# Patient Record
Sex: Female | Born: 1995 | Race: Black or African American | Hispanic: No | Marital: Single | Smoking: Never smoker
Health system: Northeastern US, Academic
[De-identification: ages and names within clinical notes are randomized; demographics above are authoritative.]

## PROBLEM LIST (undated history)

## (undated) DIAGNOSIS — Z973 Presence of spectacles and contact lenses: Secondary | ICD-10-CM

## (undated) HISTORY — DX: Presence of spectacles and contact lenses: Z97.3

---
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
## 2014-03-02 ENCOUNTER — Encounter: Payer: Self-pay | Admitting: Family Medicine

## 2014-03-02 ENCOUNTER — Ambulatory Visit (INDEPENDENT_AMBULATORY_CARE_PROVIDER_SITE_OTHER): Payer: BC Managed Care – PPO | Admitting: Family Medicine

## 2014-03-02 VITALS — BP 106/72 | HR 60 | Temp 98.2°F | Ht 64.0 in | Wt 148.0 lb

## 2014-03-02 DIAGNOSIS — H6123 Impacted cerumen, bilateral: Secondary | ICD-10-CM

## 2014-03-02 NOTE — Patient Instructions (Signed)
Cerumen Impaction °A cerumen impaction is when the wax in your ear forms a plug. This plug usually causes reduced hearing. Sometimes it also causes an earache or dizziness. Removing a cerumen impaction can be difficult and painful. The wax sticks to the ear canal. The canal is sensitive and bleeds easily. If you try to remove a heavy wax buildup with a cotton tipped swab, you may push it in further. °Irrigation with water, suction, and small ear curettes may be used to clear out the wax. If the impaction is fixed to the skin in the ear canal, ear drops may be needed for a few days to loosen the wax. People who build up a lot of wax frequently can use ear wax removal products available in your local drugstore. °SEEK MEDICAL CARE IF:  °You develop an earache, increased hearing loss, or marked dizziness. °Document Released: 03/28/2004 Document Revised: 05/13/2011 Document Reviewed: 05/18/2009 °ExitCare® Patient Information ©2015 [employer]. This information is not intended to replace advice given to you by your health care provider. Make sure you discuss any questions you have with your health care provider. ° °

## 2014-03-02 NOTE — Progress Notes (Signed)
Chief Complaint  Patient presents with  . Ear Fullness    feels like her ears clogged x 4 days.     Both ears have sound muffled for the last 4 days.  Denies any pain or discomfort--sometimes if she pushes on the front it feels a little sore.  Denies any URI or allergy symptoms. She had a sinus infection about a month ago (treated with amoxicillin, and symptoms completely resolved); at that time she was noted to have a lot of ear wax.  She was using OTC ear wax drops regularly up until about 2 weeks ago.  When she stopped using them, they seemed to clog back up.  She has been using Q-tips to clean her ears.  PMH, PSH, SH reviewed.  No current outpatient prescriptions on file prior to visit.   No current facility-administered medications on file prior to visit.   No Known Allergies  ROS:  No fevers, chills, headaches, dizziness, tinnitus.  +decreased hearing.  No URI or allergy symptoms, PND, cough, shortness of breath, chest pain, nausea, vomiting, diarrhea, rash or other complaints.  PHYSICAL EXAM: BP 106/72 mmHg  Pulse 60  Temp(Src) 98.2 F (36.8 C) (Tympanic)  Ht 5\' 4"  (1.626 m)  Wt 148 lb (67.132 kg)  BMI 25.39 kg/m2  LMP 02/20/2014 Pleasant female in no distress HEENT:  PERRL, EOMI, conjunctiva clear.  TM's and EAC's were completely occluded by hard cerumen bilaterally.  OP is clear.  Nasal mucosa was normal  After lavage--left canal was red--she had some discomfort during lavage process.  There was no inflammation/swelling.  TM slightly red, poss some fluid.  She reports that hearing was completely normal.  Right EAC and TM were completely normal afterlavage  ASSESSMENT/PLAN:  Cerumen impaction, bilateral - advised to stop using Q-tips. use debrox drops prn recurrence of symptoms

## 2014-10-12 ENCOUNTER — Encounter: Payer: Self-pay | Admitting: Family Medicine

## 2014-10-12 ENCOUNTER — Ambulatory Visit (INDEPENDENT_AMBULATORY_CARE_PROVIDER_SITE_OTHER): Payer: Commercial Managed Care - PPO | Admitting: Family Medicine

## 2014-10-12 VITALS — BP 116/72 | HR 65 | Resp 14 | Ht 64.5 in | Wt 155.0 lb

## 2014-10-12 DIAGNOSIS — Z0000 Encounter for general adult medical examination without abnormal findings: Secondary | ICD-10-CM

## 2014-10-12 NOTE — Progress Notes (Signed)
   Subjective:    Patient ID: [name], female    DOB: 11-Jun-1995, 19 y.o.   MRN: [id number]  HPI She is here mainly for an athletic checkup. This is required by her college. She does play tennis. She has had no injuries, illnesses, sprains or strains. No chest pain or shortness of breath. She does not smoke and is not sexually active. Is on no medications.   Review of Systems     Objective:   Physical Exam And in no distress. Cardiac exam shows regular rhythm without murmurs or gallops. Lungs are clear to auscultation. Her immunizations were reviewed and she is up-to-date.       Assessment & Plan:  Normal physical exam Return here as needed.

## 2014-10-25 ENCOUNTER — Encounter: Payer: Self-pay | Admitting: Family Medicine

## 2015-07-24 ENCOUNTER — Other Ambulatory Visit (INDEPENDENT_AMBULATORY_CARE_PROVIDER_SITE_OTHER): Payer: Managed Care, Other (non HMO)

## 2015-07-24 DIAGNOSIS — Z111 Encounter for screening for respiratory tuberculosis: Secondary | ICD-10-CM | POA: Diagnosis not present

## 2015-07-26 LAB — TB SKIN TEST
INDURATION: 0 mm
TB SKIN TEST: NEGATIVE

## 2016-11-11 ENCOUNTER — Telehealth: Payer: Self-pay | Admitting: Family Medicine

## 2016-11-11 NOTE — Telephone Encounter (Signed)
Pt would like to come in foe a tdap. She is starting a new job and updated tdap is required. Is this ok?

## 2016-11-11 NOTE — Telephone Encounter (Signed)
LMTCB

## 2016-11-11 NOTE — Telephone Encounter (Signed)
Set her up for nurse visit for that and see if you can also talk her into a flu shot

## 2016-11-12 NOTE — Telephone Encounter (Signed)
LMTCB

## 2016-11-13 NOTE — Telephone Encounter (Signed)
LMTCB- completing message since not able to reach pt. [name]/RLB

## 2016-11-14 ENCOUNTER — Other Ambulatory Visit: Payer: 59

## 2016-11-18 ENCOUNTER — Other Ambulatory Visit: Payer: Managed Care, Other (non HMO)

## 2016-11-18 ENCOUNTER — Other Ambulatory Visit (INDEPENDENT_AMBULATORY_CARE_PROVIDER_SITE_OTHER): Payer: 59

## 2016-11-18 DIAGNOSIS — Z111 Encounter for screening for respiratory tuberculosis: Secondary | ICD-10-CM | POA: Diagnosis not present

## 2016-11-20 LAB — TB SKIN TEST: TB Skin Test: NEGATIVE

## 2016-11-25 ENCOUNTER — Other Ambulatory Visit: Payer: 59

## 2016-11-26 ENCOUNTER — Encounter: Payer: Self-pay | Admitting: Family Medicine

## 2016-11-26 ENCOUNTER — Ambulatory Visit (INDEPENDENT_AMBULATORY_CARE_PROVIDER_SITE_OTHER): Payer: 59 | Admitting: Family Medicine

## 2016-11-26 VITALS — BP 108/70 | HR 77 | Ht 65.0 in | Wt 147.8 lb

## 2016-11-26 DIAGNOSIS — Z0000 Encounter for general adult medical examination without abnormal findings: Secondary | ICD-10-CM | POA: Diagnosis not present

## 2016-11-26 DIAGNOSIS — Z1159 Encounter for screening for other viral diseases: Secondary | ICD-10-CM

## 2016-11-26 LAB — POCT URINALYSIS DIP (PROADVANTAGE DEVICE)
BILIRUBIN UA: NEGATIVE mg/dL
Bilirubin, UA: NEGATIVE
Glucose, UA: NEGATIVE mg/dL
LEUKOCYTES UA: NEGATIVE
NITRITE UA: NEGATIVE
PH UA: 6 (ref 5.0–8.0)
RBC UA: NEGATIVE
Specific Gravity, Urine: 1.03
Urobilinogen, Ur: NEGATIVE

## 2016-11-26 NOTE — Progress Notes (Signed)
   Subjective:    Patient ID: [name], female    DOB: 26-Sep-1995, 21 y.o.   MRN: [id number]  HPI She is here for complete examination. She has enjoyed excellent health and presently is on no medications. No history of allergies, GI or pulmonary problems.her past medical history isdoes not smoke or drink. 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 She eventually wants to apply to medical school. She does take OTC vitamins  Review of Systems Negative.    Objective:   Physical Exam Alert and in no distress. Tympanic membranes and canals are normal. Pharyngeal area is normal. Neck is supple without adenopathy or thyromegaly. Cardiac exam shows a regular sinus rhythm without murmurs or gallops. Lungs are clear to auscultation.abdominal exam shows no masses or tenderness with normal bowel sounds        Assessment & Plan:  Routine general medical examination at a health care facility - Plan: POCT Urinalysis DIP (Proadvantage Device), CBC with Differential/Platelet, Comprehensive metabolic panel  Need for hepatitis B screening test - Plan: Hepatitis B surface antigen, Hepatitis B surface antibody  She has had one PPD and another one will be put on tomorrow.

## 2016-11-27 ENCOUNTER — Other Ambulatory Visit (INDEPENDENT_AMBULATORY_CARE_PROVIDER_SITE_OTHER): Payer: 59

## 2016-11-27 DIAGNOSIS — Z111 Encounter for screening for respiratory tuberculosis: Secondary | ICD-10-CM | POA: Diagnosis not present

## 2016-11-27 LAB — CBC WITH DIFFERENTIAL/PLATELET
BASOS PCT: 0.5 %
Basophils Absolute: 44 cells/uL (ref 0–200)
Eosinophils Absolute: 52 cells/uL (ref 15–500)
Eosinophils Relative: 0.6 %
HCT: 35.9 % (ref 35.0–45.0)
Hemoglobin: 12.3 g/dL (ref 11.7–15.5)
Lymphs Abs: 2279 cells/uL (ref 850–3900)
MCH: 27.9 pg (ref 27.0–33.0)
MCHC: 34.3 g/dL (ref 32.0–36.0)
MCV: 81.4 fL (ref 80.0–100.0)
MONOS PCT: 9.6 %
MPV: 9.5 fL (ref 7.5–12.5)
Neutro Abs: 5490 cells/uL (ref 1500–7800)
Neutrophils Relative %: 63.1 %
PLATELETS: 420 10*3/uL — AB (ref 140–400)
RBC: 4.41 10*6/uL (ref 3.80–5.10)
RDW: 14.4 % (ref 11.0–15.0)
TOTAL LYMPHOCYTE: 26.2 %
WBC mixed population: 835 cells/uL (ref 200–950)
WBC: 8.7 10*3/uL (ref 3.8–10.8)

## 2016-11-27 LAB — COMPREHENSIVE METABOLIC PANEL
AG Ratio: 1.7 (calc) (ref 1.0–2.5)
ALT: 8 U/L (ref 6–29)
AST: 16 U/L (ref 10–30)
Albumin: 4.6 g/dL (ref 3.6–5.1)
Alkaline phosphatase (APISO): 43 U/L (ref 33–115)
BUN: 13 mg/dL (ref 7–25)
CO2: 23 mmol/L (ref 20–32)
CREATININE: 0.93 mg/dL (ref 0.50–1.10)
Calcium: 9.8 mg/dL (ref 8.6–10.2)
Chloride: 102 mmol/L (ref 98–110)
GLOBULIN: 2.7 g/dL (ref 1.9–3.7)
GLUCOSE: 83 mg/dL (ref 65–99)
Potassium: 4.1 mmol/L (ref 3.5–5.3)
Sodium: 136 mmol/L (ref 135–146)
Total Bilirubin: 0.5 mg/dL (ref 0.2–1.2)
Total Protein: 7.3 g/dL (ref 6.1–8.1)

## 2016-11-27 LAB — HEPATITIS B SURFACE ANTIGEN: Hepatitis B Surface Ag: NONREACTIVE

## 2016-11-27 LAB — HEPATITIS B SURFACE ANTIBODY,QUALITATIVE: Hep B S Ab: NONREACTIVE

## 2016-11-29 LAB — TB SKIN TEST: TB Skin Test: NEGATIVE

## 2016-12-04 ENCOUNTER — Telehealth: Payer: Self-pay | Admitting: Family Medicine

## 2016-12-04 NOTE — Telephone Encounter (Signed)
Pt called and request I email her cpe to [email or web address].    Done

## 2017-07-31 ENCOUNTER — Telehealth: Payer: Self-pay | Admitting: Family Medicine

## 2017-07-31 NOTE — Telephone Encounter (Signed)
Patient called and requested copy of immunization record.  Done

## 2018-02-24 ENCOUNTER — Ambulatory Visit (INDEPENDENT_AMBULATORY_CARE_PROVIDER_SITE_OTHER): Payer: Self-pay | Admitting: Medical

## 2018-02-24 ENCOUNTER — Encounter: Payer: Self-pay | Admitting: Medical

## 2018-02-24 VITALS — BP 120/80 | HR 78 | Temp 97.8°F | Wt 149.4 lb

## 2018-02-24 DIAGNOSIS — Z113 Encounter for screening for infections with a predominantly sexual mode of transmission: Secondary | ICD-10-CM | POA: Insufficient documentation

## 2018-02-24 DIAGNOSIS — Z0000 Encounter for general adult medical examination without abnormal findings: Secondary | ICD-10-CM

## 2018-02-24 DIAGNOSIS — Z7185 Encounter for immunization safety counseling: Secondary | ICD-10-CM | POA: Insufficient documentation

## 2018-02-24 DIAGNOSIS — Z3009 Encounter for other general counseling and advice on contraception: Secondary | ICD-10-CM

## 2018-02-24 DIAGNOSIS — Z9189 Other specified personal risk factors, not elsewhere classified: Secondary | ICD-10-CM | POA: Insufficient documentation

## 2018-02-24 DIAGNOSIS — Z7189 Other specified counseling: Secondary | ICD-10-CM

## 2018-02-24 LAB — POCT URINALYSIS DIP (PROADVANTAGE DEVICE)
BILIRUBIN UA: NEGATIVE
Blood, UA: NEGATIVE
Glucose, UA: NEGATIVE mg/dL
Ketones, POC UA: NEGATIVE mg/dL
LEUKOCYTES UA: NEGATIVE
NITRITE UA: NEGATIVE
PH UA: 6.5 (ref 5.0–8.0)
Protein Ur, POC: NEGATIVE mg/dL
Specific Gravity, Urine: 1.015
Urobilinogen, Ur: 3.5

## 2018-02-24 NOTE — Addendum Note (Signed)
Addended by: Renelda LomaHENRY, Christasia Angeletti on: 02/24/2018 11:44 AM   Modules accepted: Orders

## 2018-02-24 NOTE — Progress Notes (Signed)
Subjective:   HPI  [name] [name] is a 22 y.o. female who presents for a physical Chief Complaint  Patient presents with  . 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  Happens to be here in town for the holidays.  Grew up coming here for medical care, would like a physical today while she is in town.  Feels healthy.  No current symptoms.  Uses condoms.   Not currently on birth control. Sexually active.  Gets bad cramps with periods.  No prior pregnancy.   Periods are regular, sometimes heavy with bad cramping at times.   No hx/o abnormal pap smear.   Had pap 10/2016 that was normal.     Reviewed their medical, surgical, family, social, medication, and allergy history and updated chart as appropriate.  Past Medical History:  Diagnosis Date  . Wears contact lenses     Past Surgical History:  Procedure Laterality Date  . ANTERIOR CRUCIATE LIGAMENT REPAIR  2010   left    Social History   Socioeconomic History  . Marital status: Single    Spouse name: Not on file  . Number of children: Not on file  . Years of education: Not on file  . Highest education level: Not on file  Occupational History  . Not on file  Social Needs  . Financial resource strain: Not on file  . Food insecurity:    Worry: Not on file    Inability: Not on file  . Transportation needs:    Medical: Not on file    Non-medical: Not on file  Tobacco Use  . Smoking status: Never Smoker  . Smokeless tobacco: Never Used  Substance and Sexual Activity  . Alcohol use: Yes    Comment: occ  . Drug use: No  . Sexual activity: Not on file  Lifestyle  . Physical activity:    Days per week: Not on file    Minutes per session: Not on file  . Stress: Not on file  Relationships  . Social connections:    Talks on  phone: Not on file    Gets together: Not on file    Attends religious service: Not on file    Active member of club or organization: Not on file    Attends meetings of clubs or organizations: Not on file    Relationship status: Not on file  . Intimate partner violence:    Fear of current or ex partner: Not on file    Emotionally abused: Not on file    Physically abused: Not on file    Forced sexual activity: Not on file  Other Topics Concern  . Not on file  Social History Narrative   [place] sister, [place].   Graduated from [employer], recruited for tennis.  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    Family History  Problem Relation Age of Onset  . Cancer Maternal Grandfather        lung  . Cancer Paternal Grandfather        melanoma  . Heart disease Neg Hx   . Diabetes Neg Hx   . Hyperlipidemia Neg Hx   . Hypertension Neg Hx      Current  Outpatient Medications:  .  Biotin w/ Vitamins C & E (HAIR/SKIN/NAILS PO), Take by mouth., Disp: , Rfl:  .  vitamin C (ASCORBIC ACID) 250 MG tablet, Take 250 mg by mouth daily., Disp: , Rfl:   No Known Allergies    Review of Systems Constitutional: -fever, -chills, -sweats, -unexpected weight change, -decreased appetite, -fatigue Allergy: -sneezing, -itching, -congestion Dermatology: -changing moles, --rash, -lumps ENT: -runny nose, -ear pain, -sore throat, -hoarseness, -sinus pain, -teeth pain, - ringing in ears, -hearing loss, -nosebleeds Cardiology: -chest pain, -palpitations, -swelling, -difficulty breathing when lying flat, -waking up short of breath Respiratory: -cough, -shortness of breath, -difficulty breathing with exercise or exertion, -wheezing, -coughing up blood Gastroenterology: -abdominal pain, -nausea, -vomiting, -diarrhea, -constipation, -blood in stool, -changes in bowel movement, -difficulty swallowing or eating Hematology: -bleeding,  -bruising  Musculoskeletal: -joint aches, -muscle aches, -joint swelling, -back pain, -neck pain, -cramping, -changes in gait Ophthalmology: denies vision changes, eye redness, itching, discharge Urology: -burning with urination, -difficulty urinating, -blood in urine, -urinary frequency, -urgency, -incontinence Neurology: -headache, -weakness, -tingling, -numbness, -memory loss, -falls, -dizziness Psychology: -depressed mood, -agitation, -sleep problems Breast/gyn: -breast tenderness, -discharge, -lumps, -vaginal discharge,- irregular periods, -heavy periods     Objective:  BP 120/80 (BP Location: Left Arm, Patient Position: Sitting)   Pulse 78   Temp 97.8 F (36.6 C)   Wt 149 lb 6.4 oz (67.8 kg)   LMP 02/07/2018   SpO2 96%   BMI 24.86 kg/m   General appearance: alert, no distress, WD/WN, African American female Skin: unremarkable HEENT: normocephalic, conjunctiva/corneas normal, sclerae anicteric, PERRLA, EOMi, nares patent, no discharge or erythema, pharynx normal Oral cavity: MMM, tongue normal, teeth normal Neck: supple, no lymphadenopathy, no thyromegaly, no masses, normal ROM, no bruits Chest: non tender, normal shape and expansion Heart: RRR, normal S1, S2, no murmurs Lungs: CTA bilaterally, no wheezes, rhonchi, or rales Abdomen: +bs, soft, non tender, non distended, no masses, no hepatomegaly, no splenomegaly, no bruits Back: non tender, normal ROM, no scoliosis Musculoskeletal: upper extremities non tender, no obvious deformity, normal ROM throughout, lower extremities non tender, no obvious deformity, normal ROM throughout Extremities: no edema, no cyanosis, no clubbing Pulses: 2+ symmetric, upper and lower extremities, normal cap refill Neurological: alert, oriented x 3, CN2-12 intact, strength normal upper extremities and lower extremities, sensation normal throughout, DTRs 2+ throughout, no cerebellar signs, gait normal Psychiatric: normal affect, behavior normal,  pleasant  Breast/gyn/rectal - deferred to gynecology    Assessment and Plan :   Encounter Diagnoses  Name Primary?  . Encounter for health maintenance examination in adult Yes  . Encounter for general counseling and advice on contraceptive management   . Screen for STD (sexually transmitted disease)   . Risk of exposure to communicable disease   . Vaccine counseling     Physical exam - discussed and counseled on healthy lifestyle, diet, exercise, preventative care, vaccinations, sick and well care, proper use of emergency dept and after hours care, and addressed their concerns.    Health screening: Advised they see their eye doctor yearly for routine vision care. Advised they see their dentist yearly for routine dental care including hygiene visits twice yearly. See your gynecologist yearly for routine gynecological care.  Discussed STD testing, discussed prevention, condom use, means of transmission  Cancer screening Counseled on self breast exams, mammograms, cervical cancer screening  Counseled on contraception, risk and benefits of contraception, she declines today but will consider options.  Vaccinations: Advised yearly influenza vaccine She got this through work  I reviewed her [facility] vaccine records and per our records she is not up-to-date on HPV or TD vaccine.  She notes getting TD vaccine through work within the past year but thinks she never had the HPV vaccine.  She will consider the HPV vaccine but declines today.   Acute issues discussed: none  Separate significant chronic issues discussed: none  [name] was seen today for other.  Diagnoses and all orders for this visit:  Encounter for health maintenance examination in adult -     CBC with Differential/Platelet -     HIV Antibody (routine testing w rflx) -     GC/Chlamydia Probe Amp -     RPR -     Hepatitis C antibody -     Hepatitis B surface antigen  Encounter for general counseling and advice on  contraceptive management  Screen for STD (sexually transmitted disease) -     HIV Antibody (routine testing w rflx) -     GC/Chlamydia Probe Amp -     RPR -     Hepatitis C antibody -     Hepatitis B surface antigen  Risk of exposure to communicable disease -     HIV Antibody (routine testing w rflx) -     RPR -     Hepatitis C antibody -     Hepatitis B surface antigen  Vaccine counseling  Follow-up pending labs, yearly for physical

## 2018-02-24 NOTE — Patient Instructions (Signed)
Contraception Choices Contraception, also called birth control, refers to methods or devices that prevent pregnancy. Hormonal methods Contraceptive implant  A contraceptive implant is a thin, plastic tube that contains a hormone. It is inserted into the upper part of the arm. It can remain in place for up to 3 years. Progestin-only injections Progestin-only injections are injections of progestin, a synthetic form of the hormone progesterone. They are given every 3 months by a health care provider. Birth control pills  Birth control pills are pills that contain hormones that prevent pregnancy. They must be taken once a day, preferably at the same time each day. Birth control patch  The birth control patch contains hormones that prevent pregnancy. It is placed on the skin and must be changed once a week for three weeks and removed on the fourth week. A prescription is needed to use this method of contraception. Vaginal ring  A vaginal ring contains hormones that prevent pregnancy. It is placed in the vagina for three weeks and removed on the fourth week. After that, the process is repeated with a new ring. A prescription is needed to use this method of contraception. Emergency contraceptive Emergency contraceptives prevent pregnancy after unprotected sex. They come in pill form and can be taken up to 5 days after sex. They work best the sooner they are taken after having sex. Most emergency contraceptives are available without a prescription. This method should not be used as your only form of birth control. Barrier methods Female condom  A female condom is a thin sheath that is worn over the penis during sex. Condoms keep sperm from going inside a woman's body. They can be used with a spermicide to increase their effectiveness. They should be disposed after a single use. Female condom  A female condom is a soft, loose-fitting sheath that is put into the vagina before sex. The condom keeps sperm  from going inside a woman's body. They should be disposed after a single use. Diaphragm  A diaphragm is a soft, dome-shaped barrier. It is inserted into the vagina before sex, along with a spermicide. The diaphragm blocks sperm from entering the uterus, and the spermicide kills sperm. A diaphragm should be left in the vagina for 6-8 hours after sex and removed within 24 hours. A diaphragm is prescribed and fitted by a health care provider. A diaphragm should be replaced every 1-2 years, after giving birth, after gaining more than 15 lb (6.8 kg), and after pelvic surgery. Cervical cap  A cervical cap is a round, soft latex or plastic cup that fits over the cervix. It is inserted into the vagina before sex, along with spermicide. It blocks sperm from entering the uterus. The cap should be left in place for 6-8 hours after sex and removed within 48 hours. A cervical cap must be prescribed and fitted by a health care provider. It should be replaced every 2 years. Sponge  A sponge is a soft, circular piece of polyurethane foam with spermicide on it. The sponge helps block sperm from entering the uterus, and the spermicide kills sperm. To use it, you make it wet and then insert it into the vagina. It should be inserted before sex, left in for at least 6 hours after sex, and removed and thrown away within 30 hours. Spermicides Spermicides are chemicals that kill or block sperm from entering the cervix and uterus. They can come as a cream, jelly, suppository, foam, or tablet. A spermicide should be inserted into the   vagina with an applicator at least 10-15 minutes before sex to allow time for it to work. The process must be repeated every time you have sex. Spermicides do not require a prescription. Intrauterine contraception Intrauterine device (IUD) An IUD is a T-shaped device that is put in a woman's uterus. There are two types:  Hormone IUD.This type contains progestin, a synthetic form of the hormone  progesterone. This type can stay in place for 3-5 years.  Copper IUD.This type is wrapped in copper wire. It can stay in place for 10 years.  Permanent methods of contraception Female tubal ligation In this method, a woman's fallopian tubes are sealed, tied, or blocked during surgery to prevent eggs from traveling to the uterus. Hysteroscopic sterilization In this method, a small, flexible insert is placed into each fallopian tube. The inserts cause scar tissue to form in the fallopian tubes and block them, so sperm cannot reach an egg. The procedure takes about 3 months to be effective. Another form of birth control must be used during those 3 months. Female sterilization This is a procedure to tie off the tubes that carry sperm (vasectomy). After the procedure, the man can still ejaculate fluid (semen). Natural planning methods Natural family planning In this method, a couple does not have sex on days when the woman could become pregnant. Calendar method This means keeping track of the length of each menstrual cycle, identifying the days when pregnancy can happen, and not having sex on those days. Ovulation method In this method, a couple avoids sex during ovulation. Symptothermal method This method involves not having sex during ovulation. The woman typically checks for ovulation by watching changes in her temperature and in the consistency of cervical mucus. Post-ovulation method In this method, a couple waits to have sex until after ovulation. Summary  Contraception, also called birth control, means methods or devices that prevent pregnancy.  Hormonal methods of contraception include implants, injections, pills, patches, vaginal rings, and emergency contraceptives.  Barrier methods of contraception can include female condoms, female condoms, diaphragms, cervical caps, sponges, and spermicides.  There are two types of IUDs (intrauterine devices). An IUD can be put in a woman's uterus to  prevent pregnancy for 3-5 years.  Permanent sterilization can be done through a procedure for males, females, or both.  Natural family planning methods involve not having sex on days when the woman could become pregnant. This information is not intended to replace advice given to you by your health care provider. Make sure you discuss any questions you have with your health care provider. Document Released: 02/18/2005 Document Revised: 02/20/2017 Document Reviewed: 03/23/2016 Elsevier Interactive Patient Education  2019 Elsevier Inc.     Preventing Sexually Transmitted Infections, Adult Sexually transmitted infections (STIs) are diseases that are passed (transmitted) from person to person through bodily fluids exchanged during sex or sexual contact. Bodily fluids include saliva, semen, blood, vaginal mucus, and urine. You may have an increased risk for developing an STI if you have unprotected oral, vaginal, or anal sex. Some common STIs include:  Herpes.  Hepatitis B.  Chlamydia.  Gonorrhea.  Syphilis.  HPV (human papillomavirus).  HIV (human immunodeficiency virus), the virus that can cause AIDS (acquired immunodeficiency syndrome). How can I protect myself from sexually transmitted infections? The only way to completely prevent STIs is not to have sex of any kind (practice abstinence). This includes oral, vaginal, or anal sex. If you are sexually active, take these actions to lower your risk of getting an  STI:  Have only one sex partner (be monogamous) or limit the number of sexual partners you have.  Stay up-to-date on immunizations. Certain vaccines can lower your risk of getting certain STIs, such as: ? Hepatitis A and B vaccines. You may have been vaccinated as a young child, but likely need a booster shot as a teen or young adult. ? HPV vaccine.  Use methods that prevent the exchange of body fluids between partners (barrier protection) every time you have sex. Barrier  protection can be used during oral, vaginal, or anal sex. Commonly used barrier methods include: ? Female condom. ? Female condom. ? Dental dam.  Get tested regularly for STIs. Have your sexual partner get tested regularly as well.  Avoid mixing alcohol, drugs, and sex. Alcohol and drug use can affect your ability to make good decisions and can lead to risky sexual behaviors.  Ask your health care provider about taking pre-exposure prophylaxis (PrEP) to prevent HIV infection if you: ? Have a HIV-positive sexual partner. ? Have multiple sexual partners or partners who do not know their HIV status, and do not regularly use a condom during sex. ? Use injection drugs and share needles. Birth control pills, injections, implants, and intrauterine devices (IUDs) do not protect against STIs. To prevent both STIs and pregnancy, always use a condom with another form of birth control. Some STIs, such as herpes, are spread through skin to skin contact. A condom does not protect you from getting such STIs. If you or your partner have herpes and there is an active flare with open sores, avoid all sexual contact. Why are these changes important? Taking steps to practice safe sex protects you and others. Many STIs can be cured. However, some STIs are not curable and will affect you for the rest of your life. STIs can be passed on to another person even if you do not have symptoms. What can happen if changes are not made? Certain STIs may:  Require you to take medicine for the rest of your life.  Affect your ability to have children (your fertility).  Increase your risk for developing another STI or certain serious health conditions, such as: ? Cervical cancer. ? Head and neck cancer. ? Pelvic inflammatory disease (PID) in women. ? Organ damage or damage to other parts of your body, if the infection spreads.  Be passed to a baby during childbirth. How are sexually transmitted infections treated? If you  or your partner know or think that you may have an STI:  Talk with your health care provider about what can be done to treat it. Some STIs can be treated and cured with medicines.  For curable STIs, you and your partner should avoid sex during treatment and for several days after treatment is complete.  You and your partner should both be treated at the same time, if there is any chance that your partner is infected as well. If you get treatment but your partner does not, your partner can re-infect you when you resume sexual contact.  Do not have unprotected sex. Where to find more information Learn more about sexually transmitted diseases and infections from:  Centers for Disease Control and Prevention: ? More information about specific STIs: SolutionApps.co.zawww.cdc.gov/std ? Find places to get sexual health counseling and treatment for free or for a low cost: gettested.TonerPromos.nocdc.gov  U.S. Department of Health and Human Services: NotebookPreviews.siwww.womenshealth.gov/publications/our-publications/fact-sheet/sexually-transmitted-infections.html Summary  The only way to completely prevent STIs is not to have sex (practice abstinence), including  oral, vaginal, or anal sex.  STIs can spread through saliva, semen, blood, vaginal mucus, urine, or sexual contact.  If you do have sex, limit your number of sexual partners and use a barrier protection method every time you have sex.  If you develop an STI, get treated right away and ask your partner to be treated as well. Do not resume having sex until both of you have completed treatment for the STI. This information is not intended to replace advice given to you by your health care provider. Make sure you discuss any questions you have with your health care provider. Document Released: 02/15/2016 Document Revised: 07/25/2017 Document Reviewed: 02/15/2016 Elsevier Interactive Patient Education  2019 [employer].

## 2018-02-25 LAB — CBC WITH DIFFERENTIAL/PLATELET
Basophils Absolute: 0.1 10*3/uL (ref 0.0–0.2)
Basos: 1 %
EOS (ABSOLUTE): 0.1 10*3/uL (ref 0.0–0.4)
Eos: 1 %
Hematocrit: 35.4 % (ref 34.0–46.6)
Hemoglobin: 12 g/dL (ref 11.1–15.9)
Immature Grans (Abs): 0 10*3/uL (ref 0.0–0.1)
Immature Granulocytes: 0 %
Lymphocytes Absolute: 1.4 10*3/uL (ref 0.7–3.1)
Lymphs: 24 %
MCH: 28.1 pg (ref 26.6–33.0)
MCHC: 33.9 g/dL (ref 31.5–35.7)
MCV: 83 fL (ref 79–97)
Monocytes Absolute: 0.6 10*3/uL (ref 0.1–0.9)
Monocytes: 10 %
Neutrophils Absolute: 3.7 10*3/uL (ref 1.4–7.0)
Neutrophils: 64 %
Platelets: 380 10*3/uL (ref 150–450)
RBC: 4.27 x10E6/uL (ref 3.77–5.28)
RDW: 14.4 % (ref 12.3–15.4)
WBC: 5.8 10*3/uL (ref 3.4–10.8)

## 2018-02-25 LAB — HIV ANTIBODY (ROUTINE TESTING W REFLEX): HIV Screen 4th Generation wRfx: NONREACTIVE

## 2018-02-25 LAB — RPR: RPR Ser Ql: NONREACTIVE

## 2018-02-25 LAB — HEPATITIS B SURFACE ANTIGEN: Hepatitis B Surface Ag: NEGATIVE

## 2018-02-25 LAB — HEPATITIS C ANTIBODY: Hep C Virus Ab: <0.1 s/co ratio (ref 0.0–0.9)

## 2018-02-28 LAB — GC/CHLAMYDIA PROBE AMP
Chlamydia trachomatis, NAA: NEGATIVE
Neisseria gonorrhoeae by PCR: NEGATIVE

## 2019-02-14 LAB — COVID-19 CARE EVERYWHERE: COVID-19 CARE EVERYWHERE: NEGATIVE

## 2019-03-01 ENCOUNTER — Encounter: Payer: Self-pay | Admitting: Family Medicine

## 2019-03-01 ENCOUNTER — Ambulatory Visit (INDEPENDENT_AMBULATORY_CARE_PROVIDER_SITE_OTHER): Payer: Managed Care, Other (non HMO) | Admitting: Family Medicine

## 2019-03-01 ENCOUNTER — Other Ambulatory Visit: Payer: Self-pay

## 2019-03-01 VITALS — BP 110/72 | HR 81 | Temp 97.4°F | Ht 64.0 in | Wt 148.6 lb

## 2019-03-01 DIAGNOSIS — Z0000 Encounter for general adult medical examination without abnormal findings: Secondary | ICD-10-CM | POA: Diagnosis not present

## 2019-03-01 DIAGNOSIS — F419 Anxiety disorder, unspecified: Secondary | ICD-10-CM | POA: Diagnosis not present

## 2019-03-01 LAB — POCT URINALYSIS DIP (PROADVANTAGE DEVICE)
Bilirubin, UA: NEGATIVE
Blood, UA: NEGATIVE
Glucose, UA: NEGATIVE mg/dL
Ketones, POC UA: NEGATIVE mg/dL
Leukocytes, UA: NEGATIVE
Nitrite, UA: NEGATIVE
Protein Ur, POC: NEGATIVE mg/dL
Specific Gravity, Urine: 1.02
Urobilinogen, Ur: 0.2
pH, UA: 7 (ref 5.0–8.0)

## 2019-03-01 NOTE — Addendum Note (Signed)
Addended by: Elyse Jarvis on: 03/01/2019 11:57 AM   Modules accepted: Orders

## 2019-03-01 NOTE — Progress Notes (Signed)
   Subjective:    Patient ID: [name], female    DOB: 1995/11/13, 23 y.o.   MRN: [id number]  HPI She is here for complete examination.  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  She apparently will need to take the MCAT.  She expresses anxiety over this.  She does drink socially.  She is sexually active and her boyfriend does use condoms.  Presently she is not on birth control and does plan to get a GYN in the [place] area.  Family and social history as well as health maintenance and immunizations was reviewed.   Review of Systems  All other systems reviewed and are negative.      Objective:   Physical Exam Alert and in no distress. Tympanic membranes and canals are normal. Pharyngeal area is normal. Neck is supple without adenopathy or thyromegaly. Cardiac exam shows a regular sinus rhythm without murmurs or gallops. Lungs are clear to auscultation.  Abdominal exam shows no masses or tenderness with normal bowel sounds.       Assessment & Plan:  Routine general medical examination at a health care facility  Anxiety I explained that the anxiety that she is under is totally appropriate for her situation.  Explained that she should try to use this to her advantage in terms of studying appropriately.  Also talked about learning how to turn this off so she can get sleep at night. Also discussed possibly starting on birth control pill.

## 2019-03-02 LAB — CBC WITH DIFFERENTIAL/PLATELET
Basophils Absolute: 0.1 10*3/uL (ref 0.0–0.2)
Basos: 1 %
EOS (ABSOLUTE): 0.1 10*3/uL (ref 0.0–0.4)
Eos: 1 %
Hematocrit: 37.4 % (ref 34.0–46.6)
Hemoglobin: 13 g/dL (ref 11.1–15.9)
Immature Grans (Abs): 0 10*3/uL (ref 0.0–0.1)
Immature Granulocytes: 0 %
Lymphocytes Absolute: 1.8 10*3/uL (ref 0.7–3.1)
Lymphs: 31 %
MCH: 28.8 pg (ref 26.6–33.0)
MCHC: 34.8 g/dL (ref 31.5–35.7)
MCV: 83 fL (ref 79–97)
Monocytes Absolute: 0.5 10*3/uL (ref 0.1–0.9)
Monocytes: 8 %
Neutrophils Absolute: 3.4 10*3/uL (ref 1.4–7.0)
Neutrophils: 59 %
Platelets: 449 10*3/uL (ref 150–450)
RBC: 4.51 x10E6/uL (ref 3.77–5.28)
RDW: 14.1 % (ref 11.7–15.4)
WBC: 5.8 10*3/uL (ref 3.4–10.8)

## 2019-03-02 LAB — LIPID PANEL
Chol/HDL Ratio: 2.1 ratio (ref 0.0–4.4)
Cholesterol, Total: 180 mg/dL (ref 100–199)
HDL: 84 mg/dL (ref >39)
LDL Chol Calc (NIH): 89 mg/dL (ref 0–99)
Triglycerides: 33 mg/dL (ref 0–149)
VLDL Cholesterol Cal: 7 mg/dL (ref 5–40)

## 2019-03-02 LAB — COMPREHENSIVE METABOLIC PANEL
ALT: 11 IU/L (ref 0–32)
AST: 17 IU/L (ref 0–40)
Albumin/Globulin Ratio: 1.9 (ref 1.2–2.2)
Albumin: 4.7 g/dL (ref 3.9–5.0)
Alkaline Phosphatase: 62 IU/L (ref 39–117)
BUN/Creatinine Ratio: 14 (ref 9–23)
BUN: 12 mg/dL (ref 6–20)
Bilirubin Total: 0.4 mg/dL (ref 0.0–1.2)
CO2: 23 mmol/L (ref 20–29)
Calcium: 9.9 mg/dL (ref 8.7–10.2)
Chloride: 101 mmol/L (ref 96–106)
Creatinine, Ser: 0.87 mg/dL (ref 0.57–1.00)
GFR calc Af Amer: 108 mL/min/{1.73_m2} (ref >59)
GFR calc non Af Amer: 94 mL/min/{1.73_m2} (ref >59)
Globulin, Total: 2.5 g/dL (ref 1.5–4.5)
Glucose: 93 mg/dL (ref 65–99)
Potassium: 4.1 mmol/L (ref 3.5–5.2)
Sodium: 137 mmol/L (ref 134–144)
Total Protein: 7.2 g/dL (ref 6.0–8.5)

## 2019-06-29 LAB — COVID-19 CARE EVERYWHERE: COVID-19 CARE EVERYWHERE: NEGATIVE

## 2019-07-01 ENCOUNTER — Ambulatory Visit: Admit: 2019-07-01 | Payer: 59

## 2019-07-01 ENCOUNTER — Ambulatory Visit

## 2019-07-01 ENCOUNTER — Ambulatory Visit: Admitting: Internal Medicine

## 2019-07-01 NOTE — Progress Notes (Signed)
 Checkout  Return to Clinic Other  Next Visit Notes: Televisit  Appointment Made Yes  RTC Date: 07/14/2019  Time: 9:00AM          Created By Orbie Pyo on 07/01/2019 at 09:54 AM    Electronically Signed By Orbie Pyo on 07/01/2019 at 09:57 AM

## 2019-07-01 NOTE — Progress Notes (Signed)
General Medicine Visit  .  Daisy Brown  PAP SMEAR, PATPORTALPIN, HEP C AB.    .  * [place]: [facility])  .  Patient Details and Vitals  Patient reviewed in/by:  Office  .  BMI: 25.75  .  BP: 119/ 76  Ht (inches): 65  with shoes  Weight: 154.2  BMI: 25.75  Temp: 99.1  Pulse: 97  .  Call/Visit Details:   NP  Med List: PRINTED by [facility] for patient   hd_medl: printed  .  Daisy Brown  Patient Medical History   Travel outside of the [place] in past 28 days:: No  .  In the past year, have you ... Had no falls  Difficulty with balance? NO  Use a Cane NO  Use a Walker NO  Need assistance with ambulation while here? NO  .  Daisy Brown  Tobacco use? never smoker  Does patient experience chronic pain ? NO  .  Patient Screening   .  SDOH: Housing situation: I have housing  SDOH: Run out or ability to purchase food: Never true  SDOH: Utility shutoff: No  SDOH: Miss appt lack transportation: No  SDOH: unemployed or looking for work: No  SDOH: Socially withdrawn: Sometimes  .  .  .  PHQ2 (Q1) Little Interest or pleasure in doing things: 2  PHQ2 (Q2) Feeling down, depressed or hopeless: 2  PHQ2 Score: 4  .  Abuse/Neglect (Q1) Feel unsafe in relationship: NO  Abuse/Neglect (Q2) Hurt in past year: NO  .  .  .  .  .  Data to be shared to Telemed/Office Visits   ......................................Daisy KitchenNafisa Sheikh  July 01, 2019 9:09 AM  .  .  Patient Prep Updates   .  .  .  .  .  .  .  .  Chief Complaint:   annual, est care  History of Present Illness:   24 yo F presents to est care  .  Anxiety: has struggled w/ anxiety for a long time, but exacerbated over pas  t year given recent move from home in [place] to start new job, studying for med   school exams, and the COVID-19 pandemic.   has tried CBD oil; tried gummies but too strong. made her feel paranoid, sh  e felt out of it for a week afterwards  wakes up in middle of night, can't go back to sleep  also has some depressive symptoms, no SI  has never seen a therapist or taken medication before  .  .  .  Daisy Brown  Past Medical  History:(reviewed)  Tore L meniscus ACL 2010 playing tennis  .  Family History: (reviewed)   Mother: healthy  Father: anxiety medication  MGF: lung cancer, +tob  PGF: bone cancer  HTN on father's side of family  .  Social History: (reviewed)   Originally from [place], moved to [place] 2019  Works as Designer, industrial/product at [employer]  Tob: never  EtOH: once every 3 weeks  Illicit drug use: none  Sexually active: with boyfriend, never sexually active, not on birth control  .  HM  Pap smear: pap 2018, due for repeat. Reportedly normal.   COVID-19 vaccine: first vaccine Monday  TDaP: UTD  Gardasil: did not get  .  Daisy Brown  A complete ROS was done. All positive responses are listed in HPI section.   All other systems reviewed in detail and are negative.  .  .  .  Past Medical History (changes today):  Added new problem of HX ACL TEAR 2010 (ICD-905.7) (ICD10-S83.512S)  Added new problem of ANXIETY DEPRESSION (ICD-300.4) (ICD10-F41.8)  Added new problem of ANNUAL EXAM (18+) (ICD-V70.0) (ICD10-Z00.00)  Problems Reviewed:  Done  .  Daisy Brown  No Changes to Medication ListNo Known Medications  Medications Reviewed:  Done  .  .  .  .  .  .  .  Vitals:   Ht: 65 in.  Wt: 154.2 lbs.  BMI (in-lb) 25.75  Temp: 99.1deg F.     BP (Initial [place] Screening): 119 / 76     BP (Rechecked, Actionable): 119 / 7  6 mmHg   Pulse Rate: 97 bpm  .  .  Additional PE:   Constitutional: Alert, NAD.  Head: atraumatic, normocephalic.  Eyes: EOMI, PERRL, no conjunctival injection, no icterus.  Ears: no external deformities, canals clear, TM's pearly gray, gross hearin  g intact.  Mouth: good dentition, no pharyngeal erythema or tonsillar exudates.  Neck: supple, no anterior cervical lymphadenopathy. No thyroid enlargement   or palpable nodules  CV: normal S1 and S2. No murmurs or gallops.   Respiratory: EBS b/l. Lungs CTA b/l, no wheezes or rales.   GI: soft, NTND, NBS, no hepatosplenomegaly.  Extremities: no edema in b/l lower extremities  Neuro: CN II-XII grossly  intact.   Psych: Affect and mood appropriate.   .  Assessment /T/ Plan:   24 yo F presents to est care. Symptoms c/w GAD and depression  .  GAD/depression: discussed treatment options including pharmacotherapy, ther  apy. Pt prefers to start w/ therapy. Provided info re: psychologytoday.com,   will also refer to SW. Will f/u in 2 weeks. Pt may consider medication in   the future, gave her info on SSRIs to consider. No panic attacks other than   one episode after gummy ingestion so no role for short-acting antianxiolyt  ics  .  Daisy Brown  HM: will do pap and start gardasil vaccine series at next visit in 2 months  . pt getting covid-19 vaccine monday. no role for STD testing as not sexual  ly active.    .  .  Orders (this visit):  RTC (Telemedicine) [RTC-000]  RTC (Return to Clinic) [RTC-000]  Request for Medical Records [MSC-00000]  New Preventive 18-39yo [[id number]]  .  Patient Care Plan  .  Daisy Brown  Immunization Worksheet 2019 (rev 06/09/2019)   .  .  .  .  .  .  .  .  .  .  .  .  .  Follow-up With:   .  Patient Health Questionnaire (PHQ-9)  1.  Over the last 2 weeks how often have you been bothered by any of the fo  llowing problems?            Not at All (0) -  Several Days (1) -  More Than Half the Days (2)   -  Nearly Every Day (3)  a. Little interest or pleasure in doing things..... 2  b. Feeling down, depressed, or hopeless.....  2  c. Trouble falling/staying asleep, sleeping too much..... 3  d. Feeling tired or having little energy..... 2  e. Poor appetite or overeating..... 2  f. Feeling bad about yourself - or that you are a failure or have let yours  elf or your family down..... 2  g. Trouble concentrating on things such as reading the newspaper or watchin  g television..... 2  h. Moving or speaking so slowly that other people could have  noticed.   Or   the opposite - being so fidgety or restless that you have been moving aroun  d a lot more than usual..... 0  i. Thoughts that you would be better off dead or of hurting  yourself in som  e way..... 0  .  Patient Health Questionnaire - [phone] Screening: 15 scored  ......................................Daisy KitchenNafisa Sheikh  July 01, 2019 9:12 AM  .  .  Daisy Brown  GAD-7: Anxiety Screening Tool   Over the last 2 weeks how often has the patient been bothered by the follow  ing problems:  1. Feel nervous, anxious or on edge:    2  2. Not being able to stop or control worrying:  3  3. Worrying too much about different things:    3  4. Trouble relaxing:    2  5. Being so restless that it is hard to sit still:  2  6. Becoming easily annoyed or irritable:    1  7. Feeling afraid as if something awful might happen:   2  Score: 15     Interpretation:  Severe  .  .  .  .  .  Electronically Signed by Judie Petit. Eligah East, MD on 07/01/2019 at 9:59 AM  ________________________________________________________________________

## 2019-07-14 ENCOUNTER — Ambulatory Visit

## 2019-07-14 NOTE — Telephone Encounter (Signed)
 RESPONSE/ORDERS:    [place] for telemed [place], could not be heard. LMOM  .......................................M. Eligah East, MD  Jul 14, 2019 9:04 AM               ORDERS/PROBS/MEDS/ALL     Problems:   ANNUAL EXAM (18+) (ICD-V70.0) (ICD10-Z00.00)  ANXIETY DEPRESSION (ICD-300.4) (ICD10-F41.8)  HX ACL TEAR 2010 (ICD-905.7) [phone])            Created By Foster Simpson MD on 07/14/2019 at 09:04 AM    Electronically Signed By Foster Simpson MD on 07/14/2019 at 09:05 AM

## 2019-07-26 ENCOUNTER — Ambulatory Visit

## 2019-07-26 NOTE — Telephone Encounter (Signed)
 Call Details:   Patient PCP = M. Eligah East, MD  Daisy Brown (Patient) called on Jul 26, 2019 10:02 AM.  Message taken by: Angela Adam  Primary call-back number: [phone]    Secondary call-back number: [phone] -    Call Reason(s): Message/Call-Back      ** MESSAGE / CALL-BACK.  Regarding: Pt. stated that she has yet to receive the link for her appt. tomorrow with her pcp. Please resend.    ---------- ---------- ---------- ---------- ---------- ----------       RESPONSE/ORDERS:    Spoke with the patient and explained the process of doximity video visits. All set.  ......................................Marland KitchenCourt Joy  Jul 26, 2019 10:58 AM               ORDERS/PROBS/MEDS/ALL     Problems:   ANNUAL EXAM (18+) (ICD-V70.0) (ICD10-Z00.00)  ANXIETY DEPRESSION (ICD-300.4) (ICD10-F41.8)  HX ACL TEAR 2010 (ICD-905.7) (ICD10-S83.512S)            Created By Angela Adam on 07/26/2019 at 10:02 AM    Electronically Signed By Foster Simpson MD on 07/26/2019 at 11:14 AM

## 2019-07-27 ENCOUNTER — Ambulatory Visit: Admit: 2019-07-27 | Payer: 59

## 2019-07-27 ENCOUNTER — Ambulatory Visit: Admitting: Internal Medicine

## 2019-07-27 ENCOUNTER — Ambulatory Visit

## 2019-07-27 NOTE — Telephone Encounter (Signed)
 General Medicine Tele-Medicine Visit  This visit is done remotely with myself and this patient.  Patient presents during the COVID-19 pandemic / federally declared state of public health emergency.  Visit Type: Video Visit  Patient consent for audio or video type visit:  Yes  Chief Complaint:   follow-up mood  History of Present Illness:   ANXIETY DEPRESSION (ICD-300.4) (ICD10-F41.8)  since last appt pt reports feeling much better  has est care w/ therapist which has been very helpful  she thought she didnt have anxiety until she took a gummy about a month ago, but is realizing that she did have anxiety in the back of her mind even prior to this  working on methods to manage her anxiety, feels better, isn't as nervous w/ palpitations when she wakes up first thing in the AM  depressive symptoms persist but also less severe, plan is to work on this after first focusing on anxiety w/ her therapist                Family History: (reviewed)   Mother: healthy  Father: anxiety medication  MGF: lung cancer, +tob  PGF: bone cancer  HTN on father's side of family    Social History: (reviewed)   Originally from [place], moved to [place] 2019  Works as Designer, industrial/product at [employer]  Tob: never  EtOH: once every 3 weeks  Illicit drug use: none  Sexually active: with boyfriend, never sexually active, not on birth control    HM  Pap smear: pap 2018, due for repeat. Reportedly normal.   COVID-19 vaccine: completed 07/26/19  TDaP: UTD  Gardasil: did not get      A complete ROS was done. All positive responses are listed in HPI section. All other systems reviewed in detail and are negative.      Past Medical History (prior to today's visit):  ANNUAL EXAM (18+) (ICD-V70.0) (ICD10-Z00.00)  ANXIETY DEPRESSION (ICD-300.4) (ICD10-F41.8)  HX ACL TEAR 2010 (ICD-905.7) (ICD10-S83.512S)           No Changes to Medication List                  Vitals from Patient:             Assessment & Plan:   24 yo F w/ anxiety/depression  Daisy Brown seems  more energetic and positive today. I am glad to see that therapy has already helped manage some of her symptoms. I let her know that it is an ongoing process and asked that she reach out should she at any point wish to consider augmenting her treatment with medication. She feels she is making good progress with her therapist now but will let me know. Has f/u in 2 months for routine HM.        I spent 20 minutes with the patient counseling and treating the above conditions.  Location of Provider:  Home  Location of Patient:  Home  Names of Participants for Visit: Daisy Brown and Cheri Fowler    Orders (this visit):  Est Level 3 (MDM or 20-29 min) [[id number]-[id number]]        Patient Care Plan      Immunization Worksheet 2019 (rev 06/09/2019)                             Follow-up With:           Created By Foster Simpson MD on 07/27/2019 at  02:38 PM    Electronically Signed By Foster Simpson MD on 07/27/2019 at 03:01 PM

## 2019-07-27 NOTE — Progress Notes (Signed)
 General Medicine Visit  .  Marland Kitchen  PAP SMEAR, PATPORTALPIN, HEP C AB.    .  * [place]: [facility])  .  Patient Details and Vitals  Patient reviewed in/by:  Office  .  BMI: 25.75  .  BP: 119/ 76  Ht (inches): 65  with shoes  Weight: 154.2  BMI: 25.75  Temp: 99.1  Pulse: 97  .  Call/Visit Details:   NP  Med List: PRINTED by [facility] for patient   hd_medl: printed  .  Marland Kitchen  Patient Medical History   Travel outside of the [place] in past 28 days:: No  .  In the past year, have you ... Had no falls  Difficulty with balance? NO  Use a Cane NO  Use a Walker NO  Need assistance with ambulation while here? NO  .  Marland Kitchen  Tobacco use? never smoker  Does patient experience chronic pain ? NO  .  Patient Screening   .  SDOH: Housing situation: I have housing  SDOH: Run out or ability to purchase food: Never true  SDOH: Utility shutoff: No  SDOH: Miss appt lack transportation: No  SDOH: unemployed or looking for work: No  SDOH: Socially withdrawn: Sometimes  .  .  .  PHQ2 (Q1) Little Interest or pleasure in doing things: 2  PHQ2 (Q2) Feeling down, depressed or hopeless: 2  PHQ2 Score: 4  .  Abuse/Neglect (Q1) Feel unsafe in relationship: NO  Abuse/Neglect (Q2) Hurt in past year: NO  .  .  .  .  .  Data to be shared to Telemed/Office Visits   ......................................Marland KitchenNafisa Sheikh  July 01, 2019 9:09 AM  .  .  Patient Prep Updates   .  .  .  .  .  .  .  .  Chief Complaint:   annual, est care  History of Present Illness:   24 yo F presents to est care  .  Anxiety: has struggled w/ anxiety for a long time, but exacerbated over pas  t year given recent move from home in [place] to start new job, studying for med   school exams, and the COVID-19 pandemic.   has tried CBD oil; tried gummies but too strong. made her feel paranoid, sh  e felt out of it for a week afterwards  wakes up in middle of night, can't go back to sleep  also has some depressive symptoms, no SI  has never seen a therapist or taken medication before  .  .  .  Marland Kitchen  Past Medical  History:(reviewed)  Tore L meniscus ACL 2010 playing tennis  .  Family History: (reviewed)   Mother: healthy  Father: anxiety medication  MGF: lung cancer, +tob  PGF: bone cancer  HTN on father's side of family  .  Social History: (reviewed)   Originally from [place], moved to [place] 2019  Works as Designer, industrial/product at [employer]  Tob: never  EtOH: once every 3 weeks  Illicit drug use: none  Sexually active: with boyfriend, never sexually active, not on birth control  .  HM  Pap smear: pap 2018, due for repeat. Reportedly normal.   COVID-19 vaccine: first vaccine Monday  TDaP: UTD  Gardasil: did not get  .  Marland Kitchen  A complete ROS was done. All positive responses are listed in HPI section.   All other systems reviewed in detail and are negative.  .  .  .  Past Medical History (changes today):  Added new problem of HX ACL TEAR 2010 (ICD-905.7) (ICD10-S83.512S)  Added new problem of ANXIETY DEPRESSION (ICD-300.4) (ICD10-F41.8)  Added new problem of ANNUAL EXAM (18+) (ICD-V70.0) (ICD10-Z00.00)  Problems Reviewed:  Done  .  Marland Kitchen  No Changes to Medication ListNo Known Medications  Medications Reviewed:  Done  .  .  .  .  .  .  .  Vitals:   Ht: 65 in.  Wt: 154.2 lbs.  BMI (in-lb) 25.75  Temp: 99.1deg F.     BP (Initial [place] Screening): 119 / 76     BP (Rechecked, Actionable): 119 / 7  6 mmHg   Pulse Rate: 97 bpm  .  .  Additional PE:   Constitutional: Alert, NAD.  Head: atraumatic, normocephalic.  Eyes: EOMI, PERRL, no conjunctival injection, no icterus.  Ears: no external deformities, canals clear, TM's pearly gray, gross hearin  g intact.  Mouth: good dentition, no pharyngeal erythema or tonsillar exudates.  Neck: supple, no anterior cervical lymphadenopathy. No thyroid enlargement   or palpable nodules  CV: normal S1 and S2. No murmurs or gallops.   Respiratory: EBS b/l. Lungs CTA b/l, no wheezes or rales.   GI: soft, NTND, NBS, no hepatosplenomegaly.  Extremities: no edema in b/l lower extremities  Neuro: CN II-XII grossly  intact.   Psych: Affect and mood appropriate.   .  Assessment /T/ Plan:   24 yo F presents to est care. Symptoms c/w GAD and depression  .  GAD/depression: discussed treatment options including pharmacotherapy, ther  apy. Pt prefers to start w/ therapy. Provided info re: [facility],   will also refer to SW. Will f/u in 2 weeks. Pt may consider medication in   the future, gave her info on SSRIs to consider. No panic attacks other than   one episode after gummy ingestion so no role for short-acting antianxiolyt  ics  .  Marland Kitchen  HM: will do pap and start gardasil vaccine series at next visit in 2 months  . pt getting covid-19 vaccine monday. no role for STD testing as not sexual  ly active.    .  .  Orders (this visit):  RTC (Telemedicine) [RTC-000]  RTC (Return to Clinic) [RTC-000]  Request for Medical Records [MSC-00000]  New Preventive 18-39yo [CPT-99385]  .  Patient Care Plan  .  Marland Kitchen  Immunization Worksheet 2019 (rev 06/09/2019)   .  .  .  .  .  .  .  .  .  .  .  .  .  Follow-up With:   .  Patient Health Questionnaire (PHQ-9)  1.  Over the last 2 weeks how often have you been bothered by any of the fo  llowing problems?            Not at All (0) -  Several Days (1) -  More Than Half the Days (2)   -  Nearly Every Day (3)  a. Little interest or pleasure in doing things..... 2  b. Feeling down, depressed, or hopeless.....  2  c. Trouble falling/staying asleep, sleeping too much..... 3  d. Feeling tired or having little energy..... 2  e. Poor appetite or overeating..... 2  f. Feeling bad about yourself - or that you are a failure or have let yours  elf or your family down..... 2  g. Trouble concentrating on things such as reading the newspaper or watchin  g television..... 2  h. Moving or speaking so slowly that other people could have  noticed.   Or   the opposite - being so fidgety or restless that you have been moving aroun  d a lot more than usual..... 0  i. Thoughts that you would be better off dead or of hurting  yourself in som  e way..... 0  .  Patient Health Questionnaire - [phone] Screening: 15 scored  ......................................Marland KitchenNafisa Sheikh  July 01, 2019 9:12 AM  .  .  Marland Kitchen  GAD-7: Anxiety Screening Tool   Over the last 2 weeks how often has the patient been bothered by the follow  ing problems:  1. Feel nervous, anxious or on edge:    2  2. Not being able to stop or control worrying:  3  3. Worrying too much about different things:    3  4. Trouble relaxing:    2  5. Being so restless that it is hard to sit still:  2  6. Becoming easily annoyed or irritable:    1  7. Feeling afraid as if something awful might happen:   2  Score: 15     Interpretation:  Severe  .  .  .  .  .  Electronically Signed by Judie Petit. Eligah East, MD on 07/01/2019 at 9:59 AM  ________________________________________________________________________  PHQ 9 of 15 consistent with moderately severe depression  .  Marland Kitchen  Electronically Signed by Judie Petit. Eligah East, MD on 07/28/2019 at 1:52 PM  ________________________________________________________________________

## 2019-07-27 NOTE — Progress Notes (Signed)
General Medicine Tele-Medicine Visit  This visit is done remotely with myself andthis patient.  Patient presents during the COVID-19 pandemic / federally declared state of   public health emergency.  Visit Type: Video Visit  Patient consent for audio or video type visit:  Yes  Chief Complaint:   follow-up mood  History of Present Illness:   ANXIETY DEPRESSION (ICD-300.4) (ICD10-F41.8)  since last appt pt reports feeling much better  has est care w/ therapist which has been very helpful  she thought she didnt have anxiety until she took a gummy about a month ago  , but is realizing that she did have anxiety in the back of her mind even p  rior to this  working on methods to manage her anxiety, feels better, isn't as nervous w/   palpitations when she wakes up first thing in the AM  depressive symptoms persist but also less severe, plan is to work on this a  fter first focusing on anxiety w/ her therapist  .  .  .  .  .  .  .  Family History: (reviewed)   Mother: healthy  Father: anxiety medication  MGF: lung cancer, +tob  PGF: bone cancer  HTN on father's side of family  .  Social History: (reviewed)   Originally from [place], moved to [place] 2019  Works as Designer, industrial/product at [employer]  Tob: never  EtOH: once every 3 weeks  Illicit drug use: none  Sexually active: with boyfriend, never sexually active, not on birth control  .  HM  Pap smear: pap 2018, due for repeat. Reportedly normal.   COVID-19 vaccine: completed 07/26/19  TDaP: UTD  Gardasil: did not get  .  Marland Kitchen  A complete ROS was done. All positive responses are listed in HPI section.   All other systems reviewed in detail and are negative.  .  .  Past Medical History (prior to today's visit):  ANNUAL EXAM (18+) (ICD-V70.0) (ICD10-Z00.00)  ANXIETY DEPRESSION (ICD-300.4) (ICD10-F41.8)  HX ACL TEAR 2010 (ICD-905.7) (ICD10-S83.512S)  .  .  .  No Changes to Medication List   .  .  .  .  .  Theodoro Kos from Patient:   .  .  .  .  .  Assessment /T/ Plan:   24 yo F w/  anxiety/depression  Mikena seems more energetic and positive today. I am glad to see that the  rapy has already helped manage some of her symptoms. I let her know that it   is an ongoing process and asked that she reach out should she at any point   wish to consider augmenting her treatment with medication. She feels she i  s making good progress with her therapist now but will let me know. Has f/u   in 2 months for routine HM.    .  .  I spent 20 minutes with the patient counseling and treating the above condi  tions.  Location of Provider:  Home  Location of Patient:  Home  Names of Participants for Visit: Daisy Brown and M McNamara  .  Orders (this visit):  Est Level 3 (MDM or 20-29 min) [CPT-99213]  .  .  .  Patient Care Plan  .  Marland Kitchen  Immunization Worksheet 2019 (rev 06/09/2019)   .  .  .  .  .  .  .  .  .  .  .  .  .  Follow-up With:   .  .  .  Marland Kitchen  Electronically Signed by Judie Petit. Eligah East, MD on 07/27/2019 at 3:01 PM  ________________________________________________________________________

## 2019-07-28 ENCOUNTER — Ambulatory Visit

## 2020-02-23 ENCOUNTER — Other Ambulatory Visit: Payer: Managed Care, Other (non HMO)

## 2020-02-23 DIAGNOSIS — Z20822 Contact with and (suspected) exposure to covid-19: Secondary | ICD-10-CM

## 2020-02-24 LAB — NOVEL CORONAVIRUS, NAA: SARS-CoV-2, NAA: NOT DETECTED

## 2020-02-24 LAB — SARS-COV-2, NAA 2 DAY TAT

## 2020-02-28 ENCOUNTER — Other Ambulatory Visit: Payer: Managed Care, Other (non HMO)

## 2020-03-02 ENCOUNTER — Encounter: Payer: Managed Care, Other (non HMO) | Admitting: Family Medicine

## 2021-01-01 ENCOUNTER — Ambulatory Visit (HOSPITAL_BASED_OUTPATIENT_CLINIC_OR_DEPARTMENT_OTHER): Payer: PPO | Admitting: Internal Medicine

## 2021-01-22 ENCOUNTER — Ambulatory Visit (HOSPITAL_BASED_OUTPATIENT_CLINIC_OR_DEPARTMENT_OTHER): Payer: Self-pay

## 2021-01-30 ENCOUNTER — Telehealth (HOSPITAL_BASED_OUTPATIENT_CLINIC_OR_DEPARTMENT_OTHER): Admitting: Internal Medicine

## 2021-01-30 NOTE — Telephone Encounter (Signed)
 Patient called stating she is due for her pap smear patient would like to know if she can have that done at her physical 12/6  or if she needs to make another appt.     Please contact patient. Thank you.

## 2021-02-05 ENCOUNTER — Encounter

## 2021-02-06 ENCOUNTER — Encounter (HOSPITAL_BASED_OUTPATIENT_CLINIC_OR_DEPARTMENT_OTHER): Admitting: Physician Assistant

## 2021-02-06 ENCOUNTER — Ambulatory Visit
Admit: 2021-02-06 | Discharge: 2021-02-06 | Payer: PRIVATE HEALTH INSURANCE | Attending: Physician Assistant | Primary: Internal Medicine

## 2021-02-06 ENCOUNTER — Other Ambulatory Visit

## 2021-02-06 ENCOUNTER — Other Ambulatory Visit: Admit: 2021-02-06 | Payer: PRIVATE HEALTH INSURANCE | Primary: Internal Medicine

## 2021-02-06 VITALS — BP 120/74 | HR 84 | Temp 98.5°F | Ht 63.0 in | Wt 160.0 lb

## 2021-02-06 DIAGNOSIS — Z0000 Encounter for general adult medical examination without abnormal findings: Secondary | ICD-10-CM

## 2021-02-06 LAB — CBC WITH DIFFERENTIAL
Basophils %: 0.5 %
Basophils Absolute: 0.04 10*3/uL (ref 0.00–0.22)
Eosinophils %: 0.5 %
Eosinophils Absolute: 0.04 10*3/uL (ref 0.00–0.50)
Hematocrit: 37.1 % (ref 32.0–47.0)
Hemoglobin: 13 g/dL (ref 11.0–16.0)
Immature Granulocytes %: 0.3 %
Immature Granulocytes Absolute: 0.02 10*3/uL (ref 0.00–0.10)
Lymphocyte %: 26.6 %
Lymphocytes Absolute: 2.05 10*3/uL (ref 0.70–4.00)
MCH: 27.7 pg (ref 26.0–34.0)
MCHC: 35 g/dL (ref 31.0–37.0)
MCV: 78.9 fL — ABNORMAL LOW (ref 80.0–100.0)
MPV: 9.3 fL (ref 9.1–12.4)
Monocytes %: 8.3 %
Monocytes Absolute: 0.64 10*3/uL (ref 0.36–0.77)
NRBC %: 0 % (ref 0.0–0.0)
NRBC Absolute: 0 10*3/uL (ref 0.00–2.00)
Neutrophil %: 63.8 %
Neutrophils Absolute: 4.93 10*3/uL (ref 1.50–7.95)
Platelets: 404 10*3/uL — ABNORMAL HIGH (ref 150–400)
RBC: 4.7 M/uL (ref 3.70–5.20)
RDW-CV: 13.5 % (ref 11.5–14.5)
RDW-SD: 38.9 fL (ref 35.0–51.0)
WBC: 7.7 10*3/uL (ref 4.0–11.0)

## 2021-02-06 LAB — BASIC METABOLIC PANEL
Anion Gap: 10 mmol/L (ref 3–14)
BUN: 11 mg/dL (ref 6–24)
CO2 (Bicarbonate): 22 mmol/L (ref 20–32)
Calcium: 9.4 mg/dL (ref 8.5–10.5)
Chloride: 105 mmol/L (ref 98–110)
Creatinine: 0.86 mg/dL (ref 0.55–1.30)
Glucose: 88 mg/dL (ref 70–139)
Potassium: 3.7 mmol/L (ref 3.6–5.2)
Sodium: 137 mmol/L (ref 135–146)
eGFRcr: 95 mL/min/{1.73_m2} (ref >=60)

## 2021-02-06 LAB — LIPID PANEL
Cholesterol/HDL Ratio: 2.4
Cholesterol: 173 mg/dL (ref <=200)
HDL cholesterol: 73 mg/dL (ref >=40)
LDL cholesterol, calculated: 93 mg/dL (ref 0–130)
Triglycerides: 35 mg/dL (ref <=150)

## 2021-02-06 LAB — TSH WITH REFLEX: TSH: 1.34 u[IU]/mL (ref 0.35–4.94)

## 2021-02-06 LAB — TREPONEMA PALLIDUM (SYPHILIS) AB (RPR): T pallidum Antibodies: NONREACTIVE

## 2021-02-06 LAB — HEMOGLOBIN A1C: HEMOGLOBIN A1C % (INT/EXT): 5.1 % (ref <5.6)

## 2021-02-06 NOTE — Progress Notes (Signed)
 [facility]  [address]  [address]  [place] [address]  Dept: [phone]  Dept Fax: [phone]     Patient ID: Daisy Brown is a 25 y.o. female who presents for Annual Exam.    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   - over the past few weeks has been meditating, journaling, exercising, and this has improved her anxiety   - no longer speaking with therapist d/t insurance issues though did find it helpful   - she has been feeling well overall, no other complaints   - last pap smear reportedly normal in 2018, is due   - she takes no daily active medications   - she has never received or started the Gardasil vaccination series   - LMP - 01/19/21, regular, sometimes w/ bad cramping, uses NSAID   - sexually active w/ female partner   - tobacco - never   - alcohol - socially   - glasses rx utd   - dentist utd   - will screen for STD's today   - flu shot utd   - TDAP ?last in 2008, need to review w/ patient, may need to be boosted   - Gardasil - will receive first dose today   - COVID - counseled on newest COVID booster       Patient Active Problem List   Diagnosis   ? Sprain of anterior cruciate ligament of left knee   ? Other specified anxiety disorders     No current outpatient medications    Objective   Visit Vitals  BP 120/74   Pulse 84   Temp 36.9 ?C (98.5 ?F) (Oral)   Ht 1.6 m   Wt 72.6 kg   SpO2 100%   BMI 28.34 kg/m?    BSA 1.8 m?       Physical Exam  Vitals reviewed.   Constitutional:       General: She is not in acute distress.     Appearance: Normal appearance.   HENT:      Head: Normocephalic and atraumatic.      Right Ear: Tympanic membrane normal.      Left Ear: Tympanic membrane normal.      Nose: Nose normal.      Mouth/Throat:      Mouth: Mucous membranes are moist.      Pharynx: Oropharynx is clear.   Eyes:      Extraocular Movements: Extraocular movements intact.      Pupils: Pupils are equal, round, and reactive to light.   Cardiovascular:      Rate and Rhythm: Normal rate and regular rhythm.   Pulmonary:      Effort: Pulmonary effort is normal. No respiratory distress.      Breath sounds: Normal breath sounds.   Chest:   Breasts:  Right: Normal. No mass, nipple discharge, skin change or tenderness.      Left: Normal. No mass, nipple discharge, skin change or tenderness.   Abdominal:      General: Bowel sounds are normal. There is no distension.      Palpations: Abdomen is soft.      Tenderness: There is no abdominal tenderness.   Musculoskeletal:      Cervical back: Neck supple.   Neurological:      Mental Status: She is alert and oriented to person, place, and time.      Cranial Nerves: No cranial nerve deficit.      Gait: Gait normal.   Psychiatric:         Mood and Affect: Mood normal.         Behavior: Behavior normal.         Assessment/Plan   Crytal was seen today for annual exam.  Annual physical exam  -     CBC and differential  -     Basic metabolic panel; Future  -     Hemoglobin A1c; Future  -     Lipid panel; Future  -     TSH with reflex; Future  -     GC/CT (Amp DNA); Future  -     HIV Ab/Ag screen; Future  -     Treponema pallidum (Syphilis) Ab w/ Reflex to RPR Qn; Future  -     Pap Smear  -     HPV vaccine 9-valent IM  -     GC/CT (Amp DNA)  -     HIV Ab/Ag screen  Other specified anxiety disorders    RHCM   - labs as above   - STD screening as above   - pap smear in clinic today   - flu shot utd    - counseled on COVID booster   - ?due for TDAP, to discuss at next visit   - first dose of Gardasil/HPV vaccine given today, will need to RTC in 1-2 months for RN visit for second dose, and then 6 months from today's visit for final dose   - RTC in 1 year for annual exam or sooner prn     Anxiety, Mild   - GAD 7 in clinic today of 6 suggesting mild GAD   - has been focusing on meditating, exercising, journaling in the past couple of weeks with good effect   - no longer speaking w/ therapist d/t insurance issues though did find it helpful   - discussed option of adding SSRI during today's visit, after discussion, she will continue lifestyle modification at this time, and should she feel she would like to start on SSRI for management of her symptoms, she will call the clinic to discuss further

## 2021-02-07 ENCOUNTER — Encounter (HOSPITAL_BASED_OUTPATIENT_CLINIC_OR_DEPARTMENT_OTHER): Admitting: Physician Assistant

## 2021-02-07 LAB — GC/CT (AMP DNA)
CT DNA: NOT DETECTED
GC DNA: NOT DETECTED

## 2021-02-09 LAB — PAP SMEAR
Interpretation: NEGATIVE
Specimen Adequacy: ABSENT

## 2021-02-12 ENCOUNTER — Ambulatory Visit (HOSPITAL_BASED_OUTPATIENT_CLINIC_OR_DEPARTMENT_OTHER): Payer: Self-pay | Admitting: Internal Medicine

## 2021-04-10 ENCOUNTER — Ambulatory Visit: Payer: PRIVATE HEALTH INSURANCE | Primary: Internal Medicine

## 2022-02-08 ENCOUNTER — Ambulatory Visit: Payer: PRIVATE HEALTH INSURANCE | Attending: Internal Medicine | Primary: Internal Medicine

## 2022-02-27 ENCOUNTER — Ambulatory Visit: Admit: 2022-02-27 | Discharge: 2022-02-27 | Payer: PRIVATE HEALTH INSURANCE | Attending: Internal Medicine

## 2022-02-27 DIAGNOSIS — Z0000 Encounter for general adult medical examination without abnormal findings: Secondary | ICD-10-CM

## 2022-02-27 MED ORDER — triamcinolone (Kenalog) 0.1 % ointment
0.1 | Freq: Two times a day (BID) | TOPICAL | 0 refills | Status: AC | PRN
Start: 2022-02-27 — End: 2022-06-27

## 2022-02-27 NOTE — Progress Notes (Signed)
[facility]  [address]  [address]  [place] [address]  Dept: [phone]  Dept Fax: [phone]     Patient ID: Daisy Brown is a 26 y.o. female who presents for Annual Exam.    Subjective   HPI    Doing ok, although diet is not as good as it could be - busy with school and work  Forgets to eat until 2 pm  No dizziness or lightheadedness  Weight stable  Managing stress, no significant anxiety or depression    A complete ROS was done. All positive responses are listed in HPI section. All other systems reviewed in detail and are negative.    Patient Active Problem List   Diagnosis   . Sprain of anterior cruciate ligament of left knee   . Other specified anxiety disorders     Current Outpatient Medications   Medication Instructions   . triamcinolone (Kenalog) 0.1 % ointment Topical, 2 times daily PRN     No Known Allergies  PMH: anxiety  No past surgical history on file.    Family History:   Mother: healthy  Father: anxiety medication, HTN  MGF: lung cancer, +tob  PGM: HTN  PGF: bone cancer  HTN on father's side of family    Social History     Tobacco Use   . Smoking status: Never   . Smokeless tobacco: Never   Substance Use Topics   . Alcohol use: Yes   . 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   Constitutional:       General: She is not in acute distress.     Appearance: Normal appearance.   HENT:      Right Ear: Tympanic membrane, ear canal and external ear normal.       Left Ear: Tympanic membrane, ear canal and external ear normal.      Mouth/Throat:      Mouth: Mucous membranes are moist.      Pharynx: No oropharyngeal exudate or posterior oropharyngeal erythema.   Eyes:      Conjunctiva/sclera: Conjunctivae normal.      Pupils: Pupils are equal, round, and reactive to light.   Cardiovascular:      Rate and Rhythm: Normal rate and regular rhythm.      Heart sounds: No murmur heard.     No friction rub. No gallop.   Pulmonary:      Effort: Pulmonary effort is normal. No respiratory distress.      Breath sounds: Normal breath sounds. No wheezing or rales.   Chest:   Breasts:     Right: Normal. No mass or tenderness.      Left: Normal. No mass or tenderness.   Abdominal:      General: Bowel  sounds are normal. There is no distension.      Palpations: Abdomen is soft.      Tenderness: There is no abdominal tenderness. There is no guarding.   Musculoskeletal:      Cervical back: Neck supple.   Lymphadenopathy:      Cervical: No cervical adenopathy.      Upper Body:      Right upper body: No axillary adenopathy.      Left upper body: No axillary adenopathy.   Skin:     General: Skin is warm and dry.   Neurological:      General: No focal deficit present.      Mental Status: She is alert. Mental status is at baseline.   Psychiatric:         Mood and Affect: Mood normal.         Behavior: Behavior normal.         Assessment/Plan   Yanett was seen today for annual exam.  Annual physical exam  -     GC/CT (Amp DNA)  Need for HPV vaccine  -     HPV vaccine 9-valent IM  Rash  -     triamcinolone (Kenalog) 0.1 % ointment; Apply topically if needed in the morning and at bedtime for irritation or rash.    HM  Pap smear: pap 2018, 2022 NIL. Due 2025  STI testing today  Mammo at 40  Colon cancer screening at 45    Vaccines  Flu vaccine: UTD  COVID-19 vaccine: UTD  TDaP: 08/2020  Gardasil: HPV #1 02/2021, #2 02/2022. RTC 4 months for final vaccine    Patient Health Questionnaire-2 Score: 0  Interpretation: Negative screening.      Follow-up & Interventions: Maintain annual screening - No additional Follow-up required

## 2022-03-02 LAB — GC/CT (AMP DNA)
C trach NAA: NEGATIVE
N gonorrhoeae NAA: NEGATIVE

## 2022-03-25 ENCOUNTER — Ambulatory Visit: Admit: 2022-03-25 | Discharge: 2022-03-25 | Payer: BLUE CROSS/BLUE SHIELD

## 2022-03-25 DIAGNOSIS — R112 Nausea with vomiting, unspecified: Secondary | ICD-10-CM

## 2022-03-25 NOTE — Progress Notes (Signed)
Daisy Brown  [address] [place]  [place] [address]  Dept: [phone]  Dept Fax: [phone]      Chief Complaint:  Diarrhea and Vomiting    Subjective    Subjective     HPI  27 y.o. female patient who presents for Diarrhea and Vomiting    Daisy Brown is a pleasant 27 year old here today for an urgent care appointment for diarrhea and vomiting. Reports that on Friday, 1/19, she felt that her stomach began to feel uncomfortable in the evening and was subsequently followed by multiple episodes of non-bloody, non-bilious emesis. This was also associated with significantly decreased oral intake. Her symptoms improved on 1/20 but she then experienced multiple episodes of diarrhea. No blood associated with these bowel movements. Reports that she then had a fever and night sweats overnight on 1/20 but was feeling significantly improved the morning of 1/21 at which time she tried a salad which led to more vomiting. Today, she reports that she has continued episodes of nausea with a sensation that "something is stuck in the throat" without further vomiting. Reports that her diarrhea has since resolved and she has not had any further episodes of fever. Oral intake remains low, but she reports that she has been able to keep down both water and chicken broth at home. Denies any chest pain, shortness of breath, or abdominal pain at this time. No apparent sick contacts, roommate has no symptoms and they eat the same food. Only thing out of the ordinary from a dietary perspective over the past few days was a dessert pastry from a local eatery. No recent travel either domestic or foreign. No new medications and has not tried any over the counter medications. States that has has not had any episodes like this in the past.    Social History     Tobacco Use   . Smoking status: Never   . Smokeless tobacco: Never   Substance Use Topics   . Alcohol use: Yes     Comment: Socially   . Drug use: Never       No Known Allergies     Current Outpatient Medications   Medication Instructions   . triamcinolone (Kenalog) 0.1 % ointment Topical, 2 times daily PRN      Objective    Objective     Vital Signs  BP 125/75 (BP Location: Left arm)   Pulse 95   Temp 36 C (96.8 F) (Temporal)   Ht 1.651 m   Wt 71.3 kg Comment: 157.2lbs with shoes  SpO2 100%   BMI 26.16 kg/m     Physical Exam  Vitals reviewed.   Constitutional:       General: She is awake. She is not in acute distress.     Appearance: She is not ill-appearing.   HENT:      Head: Normocephalic and atraumatic.      Nose: Nose normal.      Mouth/Throat:      Mouth: Mucous membranes are dry.      Pharynx: Oropharynx is clear.   Eyes:      Conjunctiva/sclera: Conjunctivae normal.   Cardiovascular:      Rate and Rhythm: Normal rate and regular rhythm.      Heart sounds: Normal heart sounds.   Pulmonary:      Effort: Pulmonary effort is normal.      Breath sounds: Normal breath sounds.   Abdominal:  General: Bowel sounds are normal. There is no distension.      Palpations: Abdomen is soft.      Tenderness: There is no abdominal tenderness. There is no guarding or rebound.   Skin:     General: Skin is warm.   Neurological:      General: No focal deficit present.      Mental Status: She is alert.   Psychiatric:         Behavior: Behavior is cooperative.       Lab Results   Component Value Date    CHOL 173 02/06/2021    TRIG 35 02/06/2021    HDL 73 02/06/2021    LDLCALC 93 02/06/2021    HGBA1C 5.1 02/06/2021    WBC 7.7 02/06/2021    HGB 13.0 02/06/2021    HCT 37.1 02/06/2021    PLT 404 (H) 02/06/2021    NA 137 02/06/2021    K 3.7 02/06/2021    CL 105 02/06/2021    CREATININE 0.86 02/06/2021    BUN 11 02/06/2021    EGFR 95 02/06/2021    CO2 22 02/06/2021    TSH 1.34 02/06/2021        Health Maintenance Due   Topic Date Due   . HIV Screening  Never done   . Hepatitis C Screening  Never done   . COVID-19 Vaccine (4 - 2023-24 season) 11/02/2021   . Influenza Vaccine  (1) Never done   . Depression Screening  03/04/2022   . HPV Vaccines (3 - 3-dose series) 05/22/2022     Assessment/Plan    Asssessment/Plan     27 y.o. female patient with no pertinent past medical history who presents for Diarrhea and Vomiting.    Nausea and vomiting, unspecified vomiting type  Diarrhea, unspecified type  #Assessment  Patient presenting today with acute onset of nausea, emesis, and diarrhea. Initial differential was broad but given the absence of hematochezia or hematemesis the clinical suspicion for infectious diarrhea is low at this time. Additionally, given the absence of prior symptoms or episodes, along with a benign physical exam, the likelihood of a new chronic disease manifestation is low as well. Suspect at this time that the most likely etiology of her symptoms is a viral gastroenteritis due to her overall rapid symptomatic improvement and acute time course. She was hemodynamically stable with only mild tachycardia on exam today, and given her reported ability to tolerate PO intake at home, currently there is no indication for IVF resuscitation or ED referral. Discussed symptomatic management as well as return precautions as below   Plan:    Offered PRN Zofran to aid with nausea and promote PO intake. Patient declined but will reach out for prescription if nausea worsens.    Advised to continue with oral hydration along with maintaining a bland diet and avoiding dairy in the near future.    Advised to call the clinic back or present to the ED if diarrhea worsens, fever returns, or she is unable to tolerate oral intake.     The patient was staffed with the attending, Dr. Darryl Lent, MD  Internal Medicine Resident  PGY-2  Available Via [facility] Text

## 2022-06-06 MED ORDER — HPV 9-valent (Gardasil-9) 0.5 mL vaccine 0.5 mL
0.5 | Freq: Once | INTRAMUSCULAR | Status: DC
Start: 2022-06-06 — End: 2022-07-17

## 2022-06-06 MED FILL — HUMAN PAPILLOMAVIRUS VACCINE,9-VALENT(PF) 0.5 ML INTRAMUSCULAR SYRINGE: 0.5 0.5 mL | INTRAMUSCULAR | Qty: 0.5

## 2022-07-02 ENCOUNTER — Ambulatory Visit: Payer: BLUE CROSS/BLUE SHIELD | Primary: Internal Medicine

## 2022-07-02 NOTE — Progress Notes (Deleted)
Needs last HPV vaccine, order placed     [place], [place]  07/02/2022  10:11 AM

## 2022-07-17 NOTE — Telephone Encounter (Signed)
vaccine 

## 2022-08-08 NOTE — Telephone Encounter (Signed)
[facility] is calling to get a referral for patient for sleep medicine.    Dr. Gayland Curry  DX: [id number]  Appt: April 5th 2024    Please fax to [phone]

## 2022-08-22 ENCOUNTER — Emergency Department (HOSPITAL_BASED_OUTPATIENT_CLINIC_OR_DEPARTMENT_OTHER): Payer: BC Managed Care – HMO

## 2022-08-22 ENCOUNTER — Emergency Department
Admission: AD | Admit: 2022-08-22 | Discharge: 2022-08-22 | Disposition: A | Discharge status: Home / Self Care | Payer: BC Managed Care – HMO | Source: Ambulatory Visit | Attending: Emergency Medicine | Admitting: Emergency Medicine

## 2022-08-22 ENCOUNTER — Encounter (HOSPITAL_BASED_OUTPATIENT_CLINIC_OR_DEPARTMENT_OTHER): Payer: Self-pay

## 2022-08-22 ENCOUNTER — Other Ambulatory Visit: Payer: Self-pay

## 2022-08-22 DIAGNOSIS — W219XXA Striking against or struck by unspecified sports equipment, initial encounter: Secondary | ICD-10-CM | POA: Diagnosis not present

## 2022-08-22 DIAGNOSIS — M79662 Pain in left lower leg: Secondary | ICD-10-CM | POA: Insufficient documentation

## 2022-08-22 DIAGNOSIS — S8992XA Unspecified injury of left lower leg, initial encounter: Secondary | ICD-10-CM | POA: Insufficient documentation

## 2022-08-22 DIAGNOSIS — Y9239 Other specified sports and athletic area as the place of occurrence of the external cause: Secondary | ICD-10-CM | POA: Diagnosis not present

## 2022-08-22 NOTE — Narrator Note (Signed)
Eval by MD. Xray obtained.

## 2022-08-22 NOTE — Discharge Instructions (Signed)
You were seen today for a leg injury.  Your xrays did not show any broken bones or fractures.    You have bruised your leg and it will take several days to get better.  Until you feel better, be sure to rest the leg and keep it elevated whenever possible.  Keep using your compression sleeve while it is swollen.  Keep the wound covered while it is healing.  Use the crutches provided for comfort until you can go about your usual activities without it comfortably.  If you cannot go without it comfortably within 2-3 days, please see Orthopedics for follow up.  Their phone number is provided above.    GO TO AN EMERGENCY ROOM right away for severe or different pain, numbness or weakness, or for any other concerns or worsening.

## 2022-08-22 NOTE — UC Provider Notes (Signed)
I have reviewed the UC nursing notes. I have reviewed the patient's past medical history/problem list, allergies, social history and medication list.  I saw this patient primarily.    HPI:  This is a 27 year old patient presenting with left leg injury from 2 days ago when she was doing box jumps and struck her left leg.  No other injury.  Hurts in the leg to move her foot, has been uncomfortable to walk as a result.  She is using Aquaphor and bandages to the wound.  Says her tetanus is up-to-date.    History obtained by me from: Patient    ROS: Pertinent positives were reviewed as per the HPI above.     Past Medical History/Problem List:  History reviewed. No pertinent past medical history.  There is no problem list on file for this patient.      Past Surgical History:  No past surgical history on file.    Medications:   No current facility-administered medications for this encounter.     No current outpatient medications on file.       Social History:  Social History    Tobacco Use      Smoking status: Not on file      Smokeless tobacco: Not on file    Alcohol use: Not on file      Family History:   History reviewed.  No pertinent family history.       Allergies:  Review of Patient's Allergies indicates:  No Known Allergies    Physical Exam:   08/22/22  1040   BP: 115/74   Pulse: 77   Resp: 20   Temp: 98.2 F   SpO2: 98%   Weight: 72.6 kg (160 lb)     GEN: No acute distress, appears comfortable  HEENT: Atraumatic, normocephalic.  PERRL, EOMI.  Conjunctiva clear.    RESP: No respiratory distress  NEURO: Alert and oriented, CN III-XII grossly nl, MAEx4  PSYCH: Appropriate affect and thought  SKIN: Warm, dry, abrasion with surrounding ecchymosis over the anterior left lower leg  LLE: DNVI, there is abrasion and bruising as above with some soft tissue swelling, there is tenderness over the anterior mid tibia without other deformity      UC Course and Medical Decision-making:    The patient is a 27 year old with left  lower leg injury.  Wound care provided here and discussed for at home.  Did check an x-ray that is interpreted me in advance of radiology as showing no fracture.  The radiologist notes a slight abnormality in the fibula, I did discuss with Dr. 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 Valrie Hart agrees likely that is what he is seeing on x-ray however I did make the patient aware that she should review with her PCP in case they would like to do any surveillance imaging.  Meanwhile, crutches given for comfort, she already has a compression sleeve she is using.  Discussed home care and follow up plan.    Disposition:  Discharged    Condition: Stable    Diagnosis/Diagnoses:  Injury of left lower extremity, initial encounter      Lorenso Courier, MD

## 2022-08-22 NOTE — Narrator Note (Signed)
Patient Disposition  Patient education for diagnosis, medications, activity, diet and follow-up.  Patient left UC 12:43 PM.  Patient rep received written instructions.    Interpreter to provide instructions: No    Patient belongings with patient: YES    Have all existing LDAs been addressed? N/A    Have all IV infusions been stopped? N/A    Destination: Home

## 2022-08-22 NOTE — UC Triage Notes (Signed)
Abrasion to left shin 2 days ago.

## 2022-09-04 NOTE — Telephone Encounter (Signed)
Hanna from Dr. Fanny Dance office is calling to get an update on referral for patient. It's still pending in the system.    Dr Jana Hakim  NPI # [id number]  ICD  G47.33  Tel [phone]  Fax # [phone]  06/07/2022    Appointment date: 06/07/2022.

## 2023-01-13 ENCOUNTER — Ambulatory Visit: Admit: 2023-01-13 | Discharge: 2023-01-13 | Payer: BLUE CROSS/BLUE SHIELD | Attending: Adult Health

## 2023-01-13 DIAGNOSIS — Z201 Contact with and (suspected) exposure to tuberculosis: Secondary | ICD-10-CM

## 2023-01-13 DIAGNOSIS — Z1331 Encounter for screening for depression: Secondary | ICD-10-CM

## 2023-01-13 NOTE — Other (Signed)
Patient Education  Table of Contents   Tuberculosis    To view videos and all your education online visit,  [email or web address].elsevier.com/[email or web address]  or scan this QR code with your smartphone.  Access to this content will expire in one year.  Tuberculosis  Tuberculosis (TB) is an infection that usually affects the lungs but can affect other parts of the body. It is caused by bacteria. There are two forms of TB:   Active TB, also called TB disease. This means that you have TB symptoms and your infection can spread to another person (you are contagious).   Latent TB. This means that you do not have any symptoms of TB and you are not contagious.  Latent TB can turn into active TB, so it is important to get treatment no matter which form of TB you have.  What are the causes?  TB is caused by bacteria called Mycobacterium tuberculosis. You can catch the bacteria by breathing in droplets from a cough or sneeze from someone who has active TB.  What increases the risk?  You are more likely to develop TB if you:   Have human immunodeficiency virus (HIV) or AIDS.   Have diabetes.   Are older. Infants are also more likely to get TB.   Have an alcohol use disorder.   Use tobacco.   Live in or travel to areas that have high rates of TB, such as:  ? [place].  ? [place]  ? [place].  ? [place]  ? [place].  ? [place].  ? [place].   Live or work in areas that may be overcrowded or have poor airflow (ventilation), including:  ? Health care facilities.  ? Residential care facilities, such as an assisted living facility.  ? Refugee camps or shelters for people who are experiencing homelessness.  What are the signs or symptoms?  Symptoms of this condition include:   Coughing up blood, mucus from the lungs (sputum), or both.   A cough that lasts three weeks or longer.   Chest pain, or pain while breathing or coughing.   Loss of appetite or unexplained weight loss.   Tiredness (fatigue) and weakness.   Fever, sweating, or chills.  How is  this diagnosed?  This condition may be diagnosed based on a physical exam, your symptoms, and your medical history. You may have chest X-rays done. You may also have one or more of the following samples taken and tested for bacteria:   Skin.   Blood.   Sputum.   Urine.  How is this treated?  Both latent and active TB are treated with antibiotic medicine. You may need to take antibiotics for up to 6?9 months.  Follow these instructions at home:  Medicines   Take over-the-counter and prescription medicines as told by your health care provider. Finish all antibiotic medicine even when you start to feel better.  Activity   Rest as needed. Ask your health care provider what activities are safe for you.   Do not go back to work or school until your health care provider approves.   Avoid close contact with others (especially infants and older people) until your health care provider says that you are no longer contagious.  General instructions       Tell your health care provider about all the people you live with or have close contact with. Those people may need to be tested for TB.   Do not use any products that contain nicotine or tobacco. These  products include cigarettes, chewing tobacco, and vaping devices, such as e-cigarettes. If you need help quitting, ask your health care provider.   Cover your mouth and nose when you cough or sneeze. Dispose of used tissues as told by your health care provider.   Wash your hands often with soap and water. If soap and water are not available, use hand sanitizer.   Future TB testing will require blood tests or chest X-rays to check for active TB. Do not have the TB skin test done after having TB or after being exposed to TB.   Keep all follow-up visits. This is important.  Contact a health care provider if:   You have new symptoms.   You lose your appetite.   You feel nauseous or you vomit.   Your urine is dark yellow.   Your skin or the white part of your eyes turns a yellowish  color (jaundice).   Your symptoms get worse or do not go away with treatment.   You have a fever.  Get help right away if:   You have chest pain.   You cough up blood.   You have trouble breathing or feel short of breath.   You have a headache or a stiff neck.  Summary   Tuberculosis (TB) is an infection that usually affects the lungs but can affect other parts of the body. It is caused by bacteria.   The two forms of TB are active TB and latent TB. If you have active TB, your infection can spread to another person (you are contagious).   Latent TB can turn into active TB, so it is important to get treatment no matter which form of TB you have.   TB is treated with antibiotic medicine. You may need to take antibiotics for up to 6?9 months.  This information is not intended to replace advice given to you by your health care provider. Make sure you discuss any questions you have with your health care provider.  Document Released: 2000-02-16 Document Updated: 2021-04-13 Document Reviewed: 2021-03-27  Elsevier Patient Education ? 2024 Elsevier Inc.

## 2023-01-13 NOTE — Progress Notes (Signed)
[facility]  [address]  [address]  [place] [address]  Dept: [phone]  Dept Fax: [phone]       Patient ID: Daisy Brown is a 27 y.o. year old female patient who presents for follow-up.    HPI   Daisy Brown is a 27 y.o. year old female patient who presents for follow-up.    Received a notification at work that she was exposure to TB at [employer]. She works in Tourist information centre manager. States the exposure happened on 11/1 and it was likely a very brief interaction. Denies symptoms- Denies fever, chills, cough, flu like symptoms.     PAST MEDICAL HISTORY   No past medical history on file.    PAST SURGICAL HISTORY   No past surgical history on file.    ALLERGIES   No Known Allergies    HOME MEDICATIONS     Prior to Admission medications    Not on File        FAMILY HISTORY     Family History   Problem Relation Name Age of Onset    Hypertension Father Daisy Brown     Hypertension Paternal Daisy Brown      Cancer-related family history is not on file.      SOCIAL HISTORY     Social History     Social History Narrative    Not on file       PHYSICAL EXAM   Physical Exam  Constitutional:       Appearance: Normal appearance.   HENT:      Head: Normocephalic and atraumatic.      Nose: Nose normal.   Eyes:      General: No scleral icterus.     Conjunctiva/sclera: Conjunctivae normal.   Cardiovascular:      Rate and Rhythm: Normal rate and regular rhythm.      Pulses: Normal pulses.      Heart sounds: No murmur heard.  Pulmonary:      Effort: Pulmonary effort is normal. No respiratory distress.      Breath sounds: No wheezing or rales.   Abdominal:      General: Bowel sounds are normal. There is no distension.      Palpations: Abdomen is soft.   Musculoskeletal:         General: Normal range of motion.      Right lower leg: No edema.      Left lower leg: No edema.   Skin:     General: Skin is warm and dry.   Neurological:      General: No focal deficit present.       Mental Status: She is alert and oriented to person, place, and time.   Psychiatric:         Mood and Affect: Mood normal.         Behavior: Behavior normal.         Visit Vitals  BP 115/75 (BP Location: Left arm, Patient Position: Sitting, BP Cuff Size: Adult)   Pulse 71   Temp 37.2 C (99 F) (Oral)   Resp 20   Ht 1.651 m   Wt 73.5 kg   SpO2 99%   BMI 26.96 kg/m   BSA 1.84 m       Vitals:    01/13/23 0801   BP: 115/75   BP Location: Left arm   Patient Position: Sitting   BP Cuff Size: Adult   Pulse:  71   Resp: 20   Temp: 37.2 C (99 F)   TempSrc: Oral   SpO2: 99%   Weight: 73.5 kg   Height: 1.651 m     Assessment and Plan     Daisy Brown was seen today for follow-up.    Diagnoses and all orders for this visit:    Exposure to TB  -     Quantiferon TB Gold, 1T; Future  -     Quantiferon TB Gold, 1T    - Asymptomatic. Plan to have her return for Annual exam in 8 weeks and we can repeat quant gold at that time to.   - RTC sooner for symptoms of fever, chills, sweats, cough, hemoptysis, chest pain, changes in appetite or weight loss     Patient Health Questionnaire-2 Score: 0   Interpretation: Negative screening.     Follow-up & Interventions: Maintain annual screening - No additional Follow-up required     I spent 30 minutes in the care of this patient, reviewing old records, interviewing and examining the patient, discussing the plan, and documenting this encounter.

## 2023-01-16 LAB — QFT-TB GOLD PLUS (REFLEX ONLY)
QFT Mitogen Value: >10 [IU]/mL
QFT Nil Value: 0.03 [IU]/mL
QFT TB1 Ag Value: 0.03 [IU]/mL
QFT TB2 Ag Value: 0.04 [IU]/mL

## 2023-01-16 LAB — QUANTIFERON(R)-TB GOLD PLUS, 4T: QFT-TB Gold Plus: NEGATIVE

## 2023-03-14 ENCOUNTER — Ambulatory Visit: Admit: 2023-03-14 | Discharge: 2023-03-14 | Payer: BLUE CROSS/BLUE SHIELD

## 2023-03-14 DIAGNOSIS — Z0000 Encounter for general adult medical examination without abnormal findings: Secondary | ICD-10-CM

## 2023-03-14 NOTE — Assessment & Plan Note (Signed)
Patient received a notification that she was exposed to TB at her workplace on 01/03/23. Patient stated it was a brief interaction and denied symptoms on clinic follow up on 01/13/23. Quant-gold at that time was found to be negative, plan for repeat testing this visit.

## 2023-03-14 NOTE — Assessment & Plan Note (Signed)
Patient presents with reports of LUQ pain. The patient states that the night prior to the visit (1/9), she started experiencing a dull pain, 6/10, in her LUQ, which radiated to her R groin, lasting around 2 hours. She endorses improvement with massaging her LUQ. Since the morning, the patient says that the pain is intermittent with deep breathing, only in her LUQ and around 3-4/10. The patient says she goes to [employer] and recently tried a new exercise. Denies fevers, chills, lightheadedness/dizziness, SOB, chest pain, N/V, diarrhea, or dysuria. Abdominal exam in clinic is unremarkable. Suspect musculoskeletal in etiology due to nature of the pain, decreasing intensity without intervention, and no associated ROS.   - CTM  - Counseled patient on using NSAIDs if severe, unmanageable pain & to return to clinic if there is worsening in pain, onset of associated symptoms.

## 2023-03-14 NOTE — Assessment & Plan Note (Signed)
Screening  - STD (G/C, syphilis, HIV): G/C negative (02/27/22), Syphilis negative (02/06/21), HIV never tested, order placed  - PAP: last PAP negative for intraepithelial lesion or malignancy (02/06/21). Next due December 2025, patient deferred today, 1/10. Told to call and schedule a visit for PAP when she is available to complete it, within this year.     - Skin: No rashes or lesions appreciated on exam  - Hep B: Never tested, order placed  - Hep C: Never tested, order placed     Vaccines  - Covid: x3, last vaccination in 2022  - Flu: Received in 2024.   - TDap: last 08/2020, next due 2032  - HPV: completed    Preventions   - Lipids: Cholesterol 173, Triglycerides 35, HDL 73, LDL 93 (02/06/21); order placed for repeat  - A1c : 5.1% (02/06/21); patient's sister was recently diagnosed with prediabetes so the patient would like to be retested; order placed for repeat  - BP: normotensive   - discussed wearing sunscreen, routine eye/dental exams, diet/exercise, seatbelts  - Last Dental Exam: last was November 2024  - Last Eye Exam: last was May 2024    Lab Results   Component Value Date    LDLCALC 93 02/06/2021    HGBA1C 5.1 02/06/2021      Immunization History   Administered Date(s) Administered    COVID-19 Pfizer Monovalent 07/05/2019, 07/26/2019, 03/17/2020    DTaP 05/23/1994, 07/24/1994, 10/02/1994, 04/01/1995, 08/24/1999    HPV 9-Valent 02/06/2021, 02/27/2022    Hep A, Unspecified 12/30/2006, 11/19/2007    Hep B, Unspecified 1995-06-28, 04/24/1994, 12/27/1994    Hib (PRP-OMP) 05/23/1994, 07/24/1994, 10/02/1994, 04/01/1995    IPV 05/23/1994, 07/24/1994, 10/02/1994, 08/24/1999    MMR 04/01/1995, 08/24/1999    Meningococcal MCV4P 11/19/2007, 09/22/2012    PPD Test 07/24/2015, 11/18/2016, 11/27/2016    Tdap 12/30/2006, 08/30/2020    Varicella 06/03/2003, 09/22/2012

## 2023-03-14 NOTE — Progress Notes (Signed)
[facility]  [address] [place] [address]  Dept: [phone]  Dept Fax: [phone]      Chief Complaint:  Annual Exam    Subjective    Subjective     HPI  28 y.o. female patient who presents for Annual Exam.     The patient states that she is doing well. She does report that over the past week, she has been feeling very anxious. She states that in the past she usually feels anxiety in the middle of winter. She reports that she does not feeling these symptoms in other seasons. She reports heart palpitations waking her up at night. She states that last night (1/9), she said that she started experiencing a dull pain, 6/10, in her LUQ, which radiated to her R groin, lasting around 2 hours. She endorses improvement with massaging her LUQ. Since the morning, the patient says that the pain is intermittent with deep breathing, only in her LUQ and around 3-4/10. The patient says she goes to [employer] and had a new exercise where she would raise weights above her head. Denies fevers, chills, lightheadedness/dizziness, SOB, chest pain, N/V, diarrhea, or dysuria.     Patient states she gets her period regularly, last was 03/05/23-03/08/23. States that she gets lower abdominal cramps that improves with Advil use.     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 Feels safe at home.         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      Family History  family history includes Bone cancer in her paternal grandfather; Hypertension in her father and paternal grandmother; Lung cancer in her maternal grandfather.        No Known Allergies     No current outpatient medications     Objective    Objective     Vital Signs  BP 121/76   Pulse 98   Temp 36.4 C (97.5 F) (Temporal)   Ht 1.651 m   Wt 71.8 kg   SpO2 98%   BMI 26.33 kg/m     Physical Exam  Vitals reviewed.   Constitutional:       General: She is not in acute distress.  HENT:      Head: Normocephalic and atraumatic.   Eyes:      General: No scleral icterus.     Extraocular Movements: Extraocular movements intact.      Conjunctiva/sclera: Conjunctivae normal.   Neck:      Thyroid: No thyromegaly.   Cardiovascular:      Rate and Rhythm: Normal rate and regular rhythm.  Pulses: Normal pulses.      Heart sounds: Normal heart sounds, S1 normal and S2 normal. No murmur heard.  Pulmonary:      Effort: Pulmonary effort is normal. No respiratory distress.      Breath sounds: Normal breath sounds. No wheezing, rhonchi or rales.   Chest:      Chest wall: No tenderness.   Abdominal:      General: Bowel sounds are normal.      Palpations: Abdomen is soft. There is no splenomegaly.      Tenderness: There is no abdominal tenderness. There is no right CVA tenderness, left CVA tenderness, guarding or rebound.   Musculoskeletal:         General: No swelling or tenderness. Normal range of motion.      Cervical back: Normal range of motion and neck supple.      Right lower leg: No edema.      Left lower leg: No edema.   Lymphadenopathy:      Cervical: No cervical adenopathy.   Skin:     General: Skin is warm.      Coloration: Skin is not cyanotic.      Findings: No rash.      Nails: There is no clubbing.   Neurological:      General: No focal deficit present.      Mental Status: She is alert.      Motor: No weakness.      Gait:  Gait normal.   Psychiatric:         Mood and Affect: Mood and affect normal.         Cognition and Memory: Memory normal.           Lab Results   Component Value Date    CHOL 173 02/06/2021    TRIG 35 02/06/2021    HDL 73 02/06/2021    LDLCALC 93 02/06/2021    HGBA1C 5.1 02/06/2021    WBC 7.7 02/06/2021    HGB 13.0 02/06/2021    HCT 37.1 02/06/2021    PLT 404 (H) 02/06/2021    NA 137 02/06/2021    K 3.7 02/06/2021    CL 105 02/06/2021    CREATININE 0.86 02/06/2021    BUN 11 02/06/2021    EGFR 95 02/06/2021    CO2 22 02/06/2021    TSH 1.34 02/06/2021       Health Maintenance Due   Topic Date Due    HIV Screening  Never done    Hepatitis C Screening  Never done    HPV Vaccines (3 - 3-dose series) 05/22/2022    COVID-19 Vaccine (4 - 2024-25 season) 11/03/2022    Influenza Vaccine (1) Never done     Patient Health Questionnaire-2 Score: 0 Interpretation: Negative screening.      Follow-up & Interventions: Maintain annual screening - No additional Follow-up required    Generalized Anxiety Disorder Screening - GAD-7 Score: 3  Interpretation: Negative screening.  Follow up & Intervention: Maintain annual screening - No additional Follow-up required     Assessment/Plan    Asssessment/Plan     28 y.o. female patient with no pertinent past medical history presents for Annual Exam.    Health care maintenance  Assessment & Plan:  Screening  - STD (G/C, syphilis, HIV): G/C negative (02/27/22), Syphilis negative (02/06/21), HIV never tested, order placed  - PAP: last PAP negative for intraepithelial lesion or malignancy (02/06/21). Next due December 2025, patient deferred today, 1/10. Told  to call and schedule a visit for PAP when she is available to complete it, within this year.     - Skin: No rashes or lesions appreciated on exam  - Hep B: Never tested, order placed  - Hep C: Never tested, order placed     Vaccines  - Covid: x3, last vaccination in 2022  - Flu: Received in 2024.   - TDap: last 08/2020, next due 2032  - HPV:  completed    Preventions   - Lipids: Cholesterol 173, Triglycerides 35, HDL 73, LDL 93 (02/06/21); order placed for repeat  - A1c : 5.1% (02/06/21); patient's sister was recently diagnosed with prediabetes so the patient would like to be retested; order placed for repeat  - BP: normotensive   - discussed wearing sunscreen, routine eye/dental exams, diet/exercise, seatbelts  - Last Dental Exam: last was November 2024  - Last Eye Exam: last was May 2024    Lab Results   Component Value Date    LDLCALC 93 02/06/2021    HGBA1C 5.1 02/06/2021      Immunization History   Administered Date(s) Administered    COVID-19 Pfizer Monovalent 07/05/2019, 07/26/2019, 03/17/2020    DTaP 05/23/1994, 07/24/1994, 10/02/1994, 04/01/1995, 08/24/1999    HPV 9-Valent 02/06/2021, 02/27/2022    Hep A, Unspecified 12/30/2006, 11/19/2007    Hep B, Unspecified 01-22-95, 04/24/1994, 12/27/1994    Hib (PRP-OMP) 05/23/1994, 07/24/1994, 10/02/1994, 04/01/1995    IPV 05/23/1994, 07/24/1994, 10/02/1994, 08/24/1999    MMR 04/01/1995, 08/24/1999    Meningococcal MCV4P 11/19/2007, 09/22/2012    PPD Test 07/24/2015, 11/18/2016, 11/27/2016    Tdap 12/30/2006, 08/30/2020    Varicella 06/03/2003, 09/22/2012     Screening examination for STI  -     HIV Ab/Ag screen; Future  -     HIV Ab/Ag screen  Need for hepatitis B screening test  -     Hepatitis B surface antigen; Future  -     Hepatitis B surface antibody Quant; Future  -     Hepatitis B core antibody, total; Future  -     Hepatitis B surface antigen  -     Hepatitis B surface antibody Quant  -     Hepatitis B core antibody, total  Lipid screening  -     Lipid panel; Future  -     Lipid panel  Need for hepatitis C screening test  -     Hepatitis C antibody; Future  -     Hepatitis C antibody  Diabetes mellitus screening  -     Hemoglobin A1c; Future  -     Hemoglobin A1c  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 Patient  stated it was a brief interaction and denied symptoms on clinic follow up on 01/13/23. Quant-gold at that time was found to be negative, plan for repeat testing this visit.   Orders:  -     QuantiFERON - TB Gold, 1T; Future  -     QuantiFERON - TB Gold, 1T  LUQ abdominal pain  Assessment & Plan:  Patient presents with reports of LUQ pain. The patient states that the night prior to the visit (1/9), she started experiencing a dull pain, 6/10, in her LUQ, which radiated to her R groin, lasting around 2 hours. She endorses improvement with massaging her LUQ. Since the morning, the patient says that the pain is intermittent  with deep breathing, only in her LUQ and around 3-4/10. The patient says she goes to [employer] and recently tried a new exercise. Denies fevers, chills, lightheadedness/dizziness, SOB, chest pain, N/V, diarrhea, or dysuria. Abdominal exam in clinic is unremarkable. Suspect musculoskeletal in etiology due to nature of the pain, decreasing intensity without intervention, and no associated ROS.   - CTM  - Counseled patient on using NSAIDs if severe, unmanageable pain & to return to clinic if there is worsening in pain, onset of associated symptoms.  Anxiety-like symptoms  Assessment & Plan:  The patient reports a history of anxiety-like symptoms during the winter months. She reports that over the past week prior to this visit, she has been experiencing anxiety-like symptoms in the night. She reports symptoms of heart palpitations waking her up at night. The patient denies any recent anxiety provoking events or concerns with daily life, but seems to correspond with the winter season. Her anxiety and depression screening this visit was negative.   - CTM  - Patient was conseled on RTC if she has an increase in symptoms, if the symptoms are unmanageable or if they are interfering with her daily life, work, or relationships.   - Rule out thyroid disorders as below  Orders:  -     TSH with reflex;  Future  Palpitations  -     TSH with reflex; Future  -     TSH with reflex     The patient was staffed with the attending, Dr. Alison Murray Tonette Bihari, MD  PGY1, Internal Medicine  [facility]

## 2023-03-14 NOTE — Assessment & Plan Note (Addendum)
The patient reports a history of anxiety-like symptoms during the winter months. She reports that over the past week prior to this visit, she has been experiencing anxiety-like symptoms in the night. She reports symptoms of heart palpitations waking her up at night. The patient denies any recent anxiety provoking events or concerns with daily life, but seems to correspond with the winter season. Her anxiety and depression screening this visit was negative.   - CTM  - Patient was conseled on RTC if she has an increase in symptoms, if the symptoms are unmanageable or if they are interfering with her daily life, work, or relationships.   - Rule out thyroid disorders as below

## 2023-04-22 LAB — HEPATITIS B SURFACE ANTIGEN: Hep B Surf Ag Scr: NEGATIVE

## 2023-04-22 LAB — LIPID PANEL
Cholesterol: 194 mg/dL (ref 100–199)
HDL Cholesterol: 96 mg/dL (ref >39)
LDLc Calc (NIH): 91 mg/dL (ref 0–99)
Non-HDL Chol: 98 mg/dL (ref 0–129)
Triglycerides: 31 mg/dL (ref 0–149)
VLDLc Calc: 7 mg/dL (ref 5–40)

## 2023-04-22 LAB — HEPATITIS B CORE ANTIBODY, TOTAL: Hep B Core Total Ab: NEGATIVE

## 2023-04-22 LAB — HEPATITIS B SURFACE ANTIBODY QUANT: Hep B Surf Ab Qn: 155 m[IU]/mL

## 2023-04-22 LAB — HEMOGLOBIN A1C: HgbA1C: 5.3 % (ref 4.8–5.6)

## 2023-04-22 LAB — HCV AB W/REFLEX QUANT PCR (SCREENING): Hep C Virus Ab: NONREACTIVE

## 2023-04-22 LAB — TSH WITH REFLEX: TSH: 1.59 u[IU]/mL (ref 0.450–4.500)

## 2023-04-22 LAB — HIV AB/AG SCREEN: HIV Scr 4th Gen: NONREACTIVE

## 2023-04-24 LAB — QFT-TB GOLD PLUS (REFLEX ONLY)
QFT Mitogen Value: >10 [IU]/mL
QFT Nil Value: 0.03 [IU]/mL
QFT TB1 Ag Value: 0.03 [IU]/mL
QFT TB2 Ag Value: 0.04 [IU]/mL

## 2023-04-24 LAB — QUANTIFERON(R)-TB GOLD PLUS, 4T: QFT-TB Gold Plus: NEGATIVE
